# Patient Record
Sex: Female | Born: 1955 | Hispanic: No | Marital: Married | Smoking: Former smoker
Health system: Southern US, Community
[De-identification: ages and names within clinical notes are randomized; demographics above are authoritative.]

## PROBLEM LIST (undated history)

## (undated) DIAGNOSIS — E079 Disorder of thyroid, unspecified: Secondary | ICD-10-CM

## (undated) DIAGNOSIS — Z87898 Personal history of other specified conditions: Secondary | ICD-10-CM

## (undated) DIAGNOSIS — Z780 Asymptomatic menopausal state: Secondary | ICD-10-CM

## (undated) DIAGNOSIS — Z9189 Other specified personal risk factors, not elsewhere classified: Secondary | ICD-10-CM

## (undated) DIAGNOSIS — E039 Hypothyroidism, unspecified: Secondary | ICD-10-CM

## (undated) DIAGNOSIS — B977 Papillomavirus as the cause of diseases classified elsewhere: Secondary | ICD-10-CM

## (undated) DIAGNOSIS — E781 Pure hyperglyceridemia: Secondary | ICD-10-CM

## (undated) HISTORY — PX: COLONOSCOPY: SHX174

## (undated) HISTORY — DX: Asymptomatic menopausal state: Z78.0

## (undated) HISTORY — DX: Pure hyperglyceridemia: E78.1

## (undated) HISTORY — PX: BREAST SURGERY: SHX581

## (undated) HISTORY — PX: WISDOM TOOTH EXTRACTION: SHX21

## (undated) HISTORY — DX: Other specified personal risk factors, not elsewhere classified: Z91.89

## (undated) HISTORY — DX: Papillomavirus as the cause of diseases classified elsewhere: B97.7

## (undated) HISTORY — PX: AUGMENTATION MAMMAPLASTY: SUR837

---

## 1987-10-10 DIAGNOSIS — E079 Disorder of thyroid, unspecified: Secondary | ICD-10-CM

## 1987-10-10 HISTORY — DX: Disorder of thyroid, unspecified: E07.9

## 2006-02-12 ENCOUNTER — Other Ambulatory Visit: Admission: RE | Admit: 2006-02-12 | Discharge: 2006-02-12 | Payer: Self-pay | Admitting: Obstetrics and Gynecology

## 2007-03-12 ENCOUNTER — Other Ambulatory Visit: Admission: RE | Admit: 2007-03-12 | Discharge: 2007-03-12 | Payer: Self-pay | Admitting: Obstetrics & Gynecology

## 2008-03-27 ENCOUNTER — Other Ambulatory Visit: Admission: RE | Admit: 2008-03-27 | Discharge: 2008-03-27 | Payer: Self-pay | Admitting: Obstetrics and Gynecology

## 2008-10-13 ENCOUNTER — Other Ambulatory Visit: Admission: RE | Admit: 2008-10-13 | Discharge: 2008-10-13 | Payer: Self-pay | Admitting: Obstetrics and Gynecology

## 2009-10-09 HISTORY — PX: BREAST ENHANCEMENT SURGERY: SHX7

## 2010-10-09 DIAGNOSIS — Z780 Asymptomatic menopausal state: Secondary | ICD-10-CM

## 2010-10-09 HISTORY — DX: Asymptomatic menopausal state: Z78.0

## 2011-12-16 ENCOUNTER — Emergency Department
Admission: EM | Admit: 2011-12-16 | Discharge: 2011-12-16 | Disposition: A | Discharge status: Home / Self Care | Payer: Self-pay | Source: Home / Self Care

## 2011-12-16 DIAGNOSIS — T2112XA Burn of first degree of abdominal wall, initial encounter: Secondary | ICD-10-CM

## 2011-12-16 HISTORY — DX: Disorder of thyroid, unspecified: E07.9

## 2011-12-16 MED ORDER — SILVER SULFADIAZINE 1 % EX CREA: TOPICAL_CREAM | Freq: Every day | CUTANEOUS | Status: AC

## 2011-12-16 NOTE — ED Provider Notes (Signed)
Subjective:    Patient ID: Becky Price is a 56 y.o. female.  Chief Complaint:  HPI Comments: Pt was curling her hair 1 week ago and accidentally put curling iron on L groin. Pt has been putting general dressings on area for the last week. Pt started putting neosporin with bandages later in week after pt started working out. Pt wanted area evaluated to make sure that burn was not infected. Pt denies any systemic sxs including fevers, chills, generalized malaise. No purulent drainage from area, though there has been some serosanguinous drainage.   Patient is a 56 y.o. female presenting with burn. The history is provided by the patient.  Burn  Episode onset: 1 week ago  The incident occurred at home. The injury mechanism was a thermal burn. The wounds were self-inflicted. There is an injury to the left thigh. The pain is mild. There have been no prior injuries to these areas.    Past Medical History  Diagnosis Date  . Thyroid disease     History reviewed. No pertinent past surgical history.  History reviewed. No pertinent family history.  History  Substance Use Topics  . Smoking status: Former Games developer  . Smokeless tobacco: Not on file  . Alcohol Use:     Review of Systems  Constitutional: Negative for fever, chills, diaphoresis, activity change and fatigue.    Objective:   Temp 97.9 F (36.6 C)  Physical Exam  Constitutional: She appears well-developed and well-nourished.  HENT:  Head: Normocephalic and atraumatic.  Neck: Normal range of motion.  Cardiovascular: Normal rate and regular rhythm.   Pulmonary/Chest: Effort normal.  Abdominal: Soft.  Skin:       Assessment:   Procedures  There were no encounter diagnoses.  Plan:   L groin partial thickness burn: Overall healing well. Area dressed with silvadene and mepilex today. Discussed general care. Rx for silvadene given. No signs of infection. Infectious red flags discussed. Uptodate handout on burns given to  pt for review.

## 2011-12-16 NOTE — ED Provider Notes (Signed)
Pt seen by Dr. Newton  Rommie Dunn H Seana Underwood, MD 12/16/11 1204 

## 2011-12-16 NOTE — Discharge Instructions (Signed)
What are the symptoms of a skin burn? -- The symptoms depend on how bad your skin burn is. Here are the terms doctors use to describe different types of burns: Superficial skin burn (used to be called a "first-degree burn"): The burn is only on the top layer of your skin. Your skin will be dry, red, and painful (picture 1). When you press the burn, it turns white. Superficial skin burns heal in 3 to 6 days without leaving a scar.  Superficial partial-thickness burn (used to be called a "second-degree burn"): The burn is on the top 2 layers of your skin, but does not go deep into the second layer. Your skin will hurt with a light touch or if the air temperature changes. The skin will be red and leak fluid, and you might get blisters (picture 2). The burn will turn white when you press it. Superficial partial-thickness burns take 7 to 21 days to heal, and the area of skin that was burned might be darker or lighter than it used to be. The burn might or might not leave a scar.  Deep partial-thickness burn (used to be called a "third-degree burn"): This kind of burn also affects the top 2 layers of skin, but is deeper than a superficial partial-thickness burn (picture 3). The burn will hurt when you press it hard, but it will not turn white. You will get blisters. This kind of burn takes more than 21 days to heal, and will probably leave a scar.  Full-thickness burn (used to be called a "fourth-degree burn"): Full-thickness burns include all the layers of the skin and often affect the fat and muscle underneath. The burn does not usually hurt, and the burned skin can be white, gray, or black (picture 4). The skin feels dry. Your doctor will treat this kind of burn with surgery. You might also need to stay in the hospital for a time and take medicines.  Should I see a doctor or nurse? -- See your doctor or nurse right away if you are not sure how bad your burn is, or if the burn:  Involves your face, hands, feet, or  genitals  Is on or near a joint, such as your knee or shoulder  Goes all the way around a part of your body (such as your arm or leg)  Is bigger than 3 inches across or goes deep into the skin  Causes a fever of at least 100.70F or shows other signs of infection. Infected skin gets more and more red, is painful, and may leak pus.  Goes deeper than the top layer of skin AND you have not had a tetanus shot in more than 5 years People younger than 5 years or older than 70 years with any kind of skin burn should also see their doctor. Plus, people who have trouble fighting infection, for example because they have cancer or HIV, should see a doctor if they have a burn that goes deeper than a superficial burn. Is there anything I can do on my own to feel better? -- If your burn is not too severe, you can take the following steps: Clean the burn: Take any clothes off of the area and wash it with cool water and plain soap. If your clothes stick to the burn, go to the emergency room.  Cool the burn: After you have cleaned the skin, you can put a cool cloth on it or soak your skin in cool water. Do not use  ice to cool the burn.  Prevent infection: If the burn goes deeper than the top layer of skin, you are at risk of infection. To help prevent infection, you can use aloe vera gel or cream, or an antibiotic cream. If your burn forms blisters, cover it with a clean, non-stick bandage and change the bandage once or twice a day. Do not pop the blisters, because that can lead to infection.  Treat pain: If the burn hurts, try raising the burned part of your body to a level above your heart. For instance, if you burned your foot, try lying down and propping your foot up on pillows. This slows blood flow to the area and can prevent swelling and ease pain. You can also take over-the-counter pain medicine, such as acetaminophen (sold as Tylenol) or ibuprofen (sold as Advil or Motrin).  Do not scratch the burn: Scratching  can increase the risk of infection. How is a skin burn treated? -- If the burn does not need a doctor's care right away, the first step is to try the things you can do on your own. If you have a more serious burn, your doctor might give you a stronger medicine for pain or apply special bandages. For a severe burn, your doctor might suggest surgery to repair the area that was burned. He or she might also prescribe medicines to prevent infection.  Can skin burns be prevented? -- You can reduce the chances that you or your children will get burned by: Keeping candles, matches, and lighters away from children  Keeping any hot objects away from the edge of the table or stove (examples include foods or liquids, or the handles of pots and pans)  Using a humidifier with cool mist, not warm mist or steam  Keeping children away from hot stoves, fireplaces, and ovens  Having a smoke detector on each floor of your home  Dressing your children in clothes that do not catch fire easily--especially at night. Clothes made from cotton are a good choice.  Setting your hot-water heater no higher than 120F  Covering car seats and seat belts with a cloth if the car is sitting in the sun on a hot day  Using sunscreen if you are going to be in the sun More on this topic Patient information: Skin burns (Beyond the Basics) Patient information: Electrical burns (The Basics) All topics are updated as new evidence becomes available and our peer review process is complete.  This topic retrieved from UpToDate on: Dec 16, 2011.

## 2011-12-16 NOTE — ED Notes (Signed)
Dropped curling iron on upper left leg one week ago, denies pain states she just want to make sure it's not infected.

## 2013-01-13 ENCOUNTER — Ambulatory Visit (INDEPENDENT_AMBULATORY_CARE_PROVIDER_SITE_OTHER): Payer: Commercial Indemnity | Admitting: Nurse Practitioner

## 2013-01-13 ENCOUNTER — Encounter: Payer: Self-pay | Admitting: Nurse Practitioner

## 2013-01-13 VITALS — BP 102/70 | HR 76 | Resp 16 | Ht 61.0 in | Wt 115.0 lb

## 2013-01-13 DIAGNOSIS — Z01419 Encounter for gynecological examination (general) (routine) without abnormal findings: Secondary | ICD-10-CM

## 2013-01-13 DIAGNOSIS — Z1231 Encounter for screening mammogram for malignant neoplasm of breast: Secondary | ICD-10-CM

## 2013-01-13 DIAGNOSIS — R1013 Epigastric pain: Secondary | ICD-10-CM

## 2013-01-13 DIAGNOSIS — R87810 Cervical high risk human papillomavirus (HPV) DNA test positive: Secondary | ICD-10-CM

## 2013-01-13 DIAGNOSIS — Z0000 Encounter for general adult medical examination without abnormal findings: Secondary | ICD-10-CM

## 2013-01-13 LAB — POCT URINALYSIS DIPSTICK
Blood, UA: NEGATIVE
Glucose, UA: NEGATIVE
Leukocytes, UA: NEGATIVE
Nitrite, UA: NEGATIVE
Urobilinogen, UA: NEGATIVE

## 2013-01-13 NOTE — Progress Notes (Signed)
57 y.o. G0P0000 Married Caucasian Fe here for annual exam. She is postmenopausal and having no vaginal bleeding.  Not on HRT.  She has a had abdomen pain starting 3/14 sudden onset occurs as a 'pushing' pressure. Usually occurs randomly and at both upper quadrants and both lower quadrants, but not at same time.  Symptoms are more predominant upper abdomen. No increase of pain with movement or exercise - in fact being up makes symptoms fel better. . States areas feel like 'spasm'.  BM's more constipated since onset.  Took Ducolox with results and no blood or mucous. She went to [facility] on 01/05/13 and routine acute abdominal series was normal along with CBC. Then Dr. Juleen China saw pt.last Wednesday and  she is scheduled to see [facility] - Dr. Lucila Maine on Friday.  Patient has had a lot of increased stressors with death of brother and mother this past year. Husband also who worked in [place] is now home every day.  She drinks a bottle of wine daily.  Before her GI appointment on Friday is going to stop ETOH consumption to see if symptoms are improved.   No LMP recorded. Patient is postmenopausal.          Sexually active: yes  The current method of family planning is none.    Exercising: yes  Home exercise routine includes exercise ball and walking. Smoker:  no  Health Maintenance: Pap:  2 yrs ago neg MMG:  8 yrs.  Will make apt for pt. Colonoscopy:  none BMD:   none TDaP:  None, last tetanus 2005 Labs: Reviewed from 01/05/13 with nl. CBC   reports that she has quit smoking. She has never used smokeless tobacco. She reports that she drinks about 2.5 ounces of alcohol per week. She reports that she does not use illicit drugs.  Past Medical History  Diagnosis Date  . Thyroid disease     History reviewed. No pertinent past surgical history.  Current Outpatient Prescriptions  Medication Sig Dispense Refill  . levothyroxine (SYNTHROID, LEVOTHROID) 100 MCG tablet Take 125 mcg  by mouth daily.        No current facility-administered medications for this visit.    Family History  Problem Relation Age of Onset  . Pancreatic cancer Father   . Alcoholism Father   . Alcoholism Sister   . Diabetes Brother     ROS:  Pertinent items are noted in HPI.  Otherwise, a comprehensive ROS was negative.  Exam:   BP 102/70  Pulse 76  Resp 16  Ht 5\' 1"  (1.549 m)  Wt 115 lb (52.164 kg)  BMI 21.74 kg/m2  Weight change: @WEIGHT  CHANGE@ Height:   Height: 5\' 1"  (154.9 cm)  Ht Readings from Last 3 Encounters:  01/13/13 5\' 1"  (1.549 m)    General appearance: alert, cooperative and appears stated age Head: Normocephalic, without obvious abnormality, atraumatic Neck: no adenopathy, supple, symmetrical, trachea midline and thyroid normal to inspection and palpation Lungs: clear to auscultation bilaterally Breasts: normal appearance, no masses or tenderness Heart: regular rate and rhythm Abdomen: soft, non-tender; bowel sounds normal; no masses,  no organomegaly, no distinct tenderness any quadrants. Extremities: extremities normal, atraumatic, no cyanosis or edema Skin: Skin color, texture, turgor normal. No rashes or lesions Lymph nodes: Cervical, supraclavicular, and axillary nodes normal. No abnormal inguinal nodes palpated Neurologic: Grossly normal   Pelvic: External genitalia:  no lesions  Urethra:  normal appearing urethra with no masses, tenderness or lesions              Bartholin's and Skene's: normal                 Vagina: normal appearing vagina with normal color and discharge, no lesions              Cervix: anteverted              Pap taken: yes and HR HPV Bimanual Exam:  Uterus:  normal size, contour, position, consistency, mobility, non-tender              Adnexa: no mass, fullness, tenderness and could not reproduce similiar pain.               Rectovaginal: Confirms               Anus:  normal sphincter tone, no lesions  A:  Well Woman  with normal exam  Recent onset of Abdominal pain - to see GI on 01/17/13  Family Stressors with recent increase in ETOH consumption.   P:   [facility] and have them to call patient with date  pap smear  counseled on mammography screening, adequate intake of  calcium and vitamin D, diet and exercise  return annually or prn  An After Visit Summary was printed and given to the patient.

## 2013-01-13 NOTE — Patient Instructions (Addendum)

## 2013-01-15 NOTE — Progress Notes (Signed)
Encounter reviewed by Dr. Infantof Villagomez Silva.  

## 2013-01-17 LAB — HEMOGLOBIN, FINGERSTICK: Hemoglobin, fingerstick: 13 g/dL (ref 12.0–16.0)

## 2013-01-17 NOTE — Addendum Note (Signed)
Addended by: Clide Dales R on: 01/17/2013 02:49 PM   Modules accepted: Orders

## 2013-02-04 ENCOUNTER — Telehealth: Payer: Self-pay | Admitting: *Deleted

## 2013-02-04 LAB — IPS PAP TEST WITH HPV

## 2013-02-04 NOTE — Telephone Encounter (Signed)
Opened in error

## 2014-07-14 ENCOUNTER — Telehealth: Payer: Self-pay | Admitting: *Deleted

## 2014-07-14 NOTE — Telephone Encounter (Signed)
S/W pt re: AEX repeat pap. recall 8. Patient scheduled appt 07/01/14 @11 :15am

## 2014-07-31 ENCOUNTER — Ambulatory Visit (INDEPENDENT_AMBULATORY_CARE_PROVIDER_SITE_OTHER): Payer: Commercial Indemnity | Admitting: Nurse Practitioner

## 2014-07-31 ENCOUNTER — Encounter: Payer: Self-pay | Admitting: Nurse Practitioner

## 2014-07-31 VITALS — BP 120/70 | HR 64 | Ht 61.0 in | Wt 117.0 lb

## 2014-07-31 DIAGNOSIS — Z0000 Encounter for general adult medical examination without abnormal findings: Secondary | ICD-10-CM

## 2014-07-31 DIAGNOSIS — Z1211 Encounter for screening for malignant neoplasm of colon: Secondary | ICD-10-CM

## 2014-07-31 DIAGNOSIS — Z01419 Encounter for gynecological examination (general) (routine) without abnormal findings: Secondary | ICD-10-CM

## 2014-07-31 LAB — POCT URINALYSIS DIPSTICK
BILIRUBIN UA: NEGATIVE
Blood, UA: NEGATIVE
Glucose, UA: NEGATIVE
KETONES UA: NEGATIVE
NITRITE UA: NEGATIVE
PROTEIN UA: NEGATIVE
Urobilinogen, UA: NEGATIVE
pH, UA: 7

## 2014-07-31 LAB — HEMOGLOBIN, FINGERSTICK: HEMOGLOBIN, FINGERSTICK: 13 g/dL (ref 12.0–16.0)

## 2014-07-31 NOTE — Progress Notes (Signed)
Patient ID: Becky Price, female   DOB: 06/02/1956, 58 y.o.   MRN: [id number] 58 y.o. G0P0 Married Caucasian Fe here for annual exam.  She feels well.  No new health problems.  No vaso symptoms.  Husband is now retired does not work in [place] any longer!  Patient's last menstrual period was 08/09/2010.          Sexually active: yes  The current method of family planning is post menopausal status.  Exercising: yes Home exercise routine includes cardio. Smoker: no   Health Maintenance:  Pap: 01/13/13, WNL, pos HR HPV, neg 16/18  (DES Exposure - pap yearly) MMG:  Pt overdue, would like referral to location that is good with implants. Colonoscopy: none  BMD: none  TDaP: last tetanus 2005 (declines update today) Labs:  HB:  13.0  Urine:  Trace leuk's (contaminated - dropped cup)   reports that she has quit smoking. She has never used smokeless tobacco. She reports that she drinks about 3.5 ounces of alcohol per week. She reports that she does not use illicit drugs.  Past Medical History  Diagnosis Date  . Postmenopausal state 2012    No HRT  . DES exposure in utero   . Thyroid disease 1989    Dr. Juleen ChinaKohut    Past Surgical History  Procedure Laterality Date  . Breast enhancement surgery  2011    Current Outpatient Prescriptions  Medication Sig Dispense Refill  . levothyroxine (SYNTHROID, LEVOTHROID) 125 MCG tablet Take 125 mcg by mouth daily before breakfast.       No current facility-administered medications for this visit.    Family History  Problem Relation Age of Onset  . Pancreatic cancer Father [id number]  . Alcoholism Father   . Alcoholism Sister   . Diabetes Brother   . Heart failure Maternal Grandfather     elderly    ROS:  Pertinent items are noted in HPI.  Otherwise, a comprehensive ROS was negative.  Exam:   BP 120/70  Pulse 64  Ht 5\' 1"  (1.549 m)  Wt 117 lb (53.071 kg)  BMI 22.12 kg/m2  LMP 08/09/2010 Height: 5\' 1"  (154.9 cm)  Ht Readings from Last 3 Encounters:   07/31/14 5\' 1"  (1.549 m)  01/13/13 5\' 1"  (1.549 m)    General appearance: alert, cooperative and appears stated age Head: Normocephalic, without obvious abnormality, atraumatic Neck: no adenopathy, supple, symmetrical, trachea midline and thyroid normal to inspection and palpation Lungs: clear to auscultation bilaterally Breasts: normal appearance, no masses or tenderness, positive findings: implants bilaterally Heart: regular rate and rhythm Abdomen: soft, non-tender; no masses,  no organomegaly Extremities: extremities normal, atraumatic, no cyanosis or edema Skin: Skin color, texture, turgor normal. No rashes or lesions Lymph nodes: Cervical, supraclavicular, and axillary nodes normal. No abnormal inguinal nodes palpated Neurologic: Grossly normal   Pelvic: External genitalia:  no lesions              Urethra:  normal appearing urethra with no masses, tenderness or lesions              Bartholin's and Skene's: normal                 Vagina: normal appearing vagina with normal color and discharge, no lesions              Cervix: anteverted              Pap taken: Yes.   Bimanual Exam:  Uterus:  normal size,  contour, position, consistency, mobility, non-tender              Adnexa: no mass, fullness, tenderness               Rectovaginal: Confirms               Anus:  normal sphincter tone, no lesion  A:         Well Woman with normal exam  Postmenopausal  - no HRT  History of infertility          History of DES exposure  S/P Breast Augmentation  Hypothyroid - followed by Endo   P:   Reviewed health and wellness pertinent to exam  Pap smear taken today  Mammogram is past due and [facility]  Referral to Dr. Loreta AveMann for screening colonoscopy  Counseled on breast self exam, mammography screening, adequate intake of calcium and vitamin D, diet and exercise, Kegel's exercises return annually or prn  An After Visit Summary was printed and given to the  patient.

## 2014-07-31 NOTE — Patient Instructions (Signed)

## 2014-08-02 NOTE — Progress Notes (Signed)
Encounter reviewed by Dr. Brook Silva.  

## 2014-08-06 LAB — IPS PAP TEST WITH HPV

## 2014-08-13 ENCOUNTER — Telehealth: Payer: Self-pay | Admitting: Emergency Medicine

## 2014-08-13 DIAGNOSIS — B977 Papillomavirus as the cause of diseases classified elsewhere: Secondary | ICD-10-CM

## 2014-08-13 NOTE — Telephone Encounter (Signed)
-----   Message from Lauro FranklinPatricia Rolen-Grubb, FNP sent at 08/10/2014  9:07 PM EST ----- Per Dr. Edward JollySilva please schedule colo biopsy for this patient - second time with normal pap and HR HPV

## 2014-08-13 NOTE — Telephone Encounter (Signed)
Patient notified of message from Lauro FranklinPatricia Rolen-Grubb, FNP.   She is agreeable to scheduling colposcopy. Brief description of procedure given to patient.  Colposcopy pre-procedure instructions given. Advised 800 mg of Motrin with food one hour prior to appointment. Motrin=Advil=Ibuprofen Can take 800 mg (Can purchase over the counter, you will need four 200 mg pills). Make sure to eat a meal before appointment and drink plenty of fluids. Patient verbalized understanding and will call to reschedule if will be on menses or has any concerns regarding pregnancy. Advised will need to cancel within 24 hours or will have $100.00 no show fee placed to account.  Type of birth control: The current method of family planning is post menopausal status.    Sabrina, I have scheduled procedures. Can you call with insurance pre certification information?  Routing to provider for final review. Patient agreeable to disposition. Will close encounter

## 2014-08-14 ENCOUNTER — Telehealth: Payer: Self-pay | Admitting: Obstetrics and Gynecology

## 2014-08-14 NOTE — Telephone Encounter (Signed)
Patient returned call. Advised patient that per benefit quote received, she will be responsible to pay $200.01 when she comes in for colpo. Patient agreeable.

## 2014-08-14 NOTE — Telephone Encounter (Signed)
Left message for patient to call back. Need to go over benefits for colpo scheduled 11.19.2015. PR: $200.01

## 2014-08-27 ENCOUNTER — Encounter: Payer: Self-pay | Admitting: Obstetrics and Gynecology

## 2014-08-27 ENCOUNTER — Ambulatory Visit (INDEPENDENT_AMBULATORY_CARE_PROVIDER_SITE_OTHER): Payer: Commercial Indemnity | Admitting: Obstetrics and Gynecology

## 2014-08-27 VITALS — BP 118/76 | HR 64 | Ht 61.0 in | Wt 119.6 lb

## 2014-08-27 DIAGNOSIS — B977 Papillomavirus as the cause of diseases classified elsewhere: Secondary | ICD-10-CM

## 2014-08-27 DIAGNOSIS — N952 Postmenopausal atrophic vaginitis: Secondary | ICD-10-CM

## 2014-08-27 NOTE — Patient Instructions (Signed)

## 2014-08-27 NOTE — Progress Notes (Signed)
Subjective:     Patient ID: Lowanda FosterJulie Kushnir, female   DOB: 06/16/1956, 58 y.o.   MRN: [id number]  HPI  Patient is here for colposcopy today.  Pap 07/31/14 - normal and positive HR HPV. Pap 04/14/13 - normal and positive HR HPV.  Negative HPV 16 and 18.   History of DES exposure.  Postmenopausal.  Not using any HRT. States she does not have any problems with bleeding with intercourse.  Notes general vaginal itching.   Several years since doing a mammogram.  No childbirth.   Review of Systems     Objective:   Physical Exam  Genitourinary:     Colposcopy Consent for procedure. Pederson speculum placed in the vagina.  No discharge noted.  3% acetic acid placed.  Colposcopy performed.  Pin point opening of the cervical os - unsatisfactory colposcopy.  Severe atrophy noted and tissues developed petechial changes. Exam uncomfortable for the patient and she exhibited signs of anxiety throughout the procedure but not afterward. Biopsies of 6:00 and 12:00 taken of the exocervix.  Each sent separately to pathology.  Unable to do ECC even with a pap brush.  Minimal bleeding.  No complications.     Assessment:     Positive HR HPV.  Severe atrophy of the vagina. History of DES exposure.     Plan:     Colposcopy instructions given.  Follow up biopsies.  I discussed vaginal estrogen with the patient.  Risks of breast cancer, DVT, PE, MI, and stroke reviewed.  Patient needs to do her mammogram prior to a prescription.  She will make this call and schedule this.   15 additional minutes spent discussing vaginal estrogen cream and atrophic vaginitis.  Over 50% was spent in counseling.   After visit summary to patient.

## 2014-09-01 LAB — IPS OTHER TISSUE BIOPSY

## 2014-09-07 ENCOUNTER — Other Ambulatory Visit: Payer: Self-pay

## 2014-09-07 ENCOUNTER — Telehealth: Payer: Self-pay | Admitting: Obstetrics and Gynecology

## 2014-09-07 DIAGNOSIS — Z1231 Encounter for screening mammogram for malignant neoplasm of breast: Secondary | ICD-10-CM

## 2014-09-07 NOTE — Telephone Encounter (Signed)
Notes Recorded by Alphonsa OverallAmanda L Dixon, CMA on 09/07/2014 at 1:05 PM 08 pap recall entered 05-10-15. ------  Notes Recorded by Alphonsa OverallAmanda L Dixon, CMA on 09/07/2014 at 1:01 PM Patient notified and states she will call [facility] today and make an appointment for a mammogram.

## 2014-09-07 NOTE — Telephone Encounter (Signed)
Patient has been informed of results as below and has appointment scheduled at [facility] 09/23/14 at 1620.  Routing to provider for final review. Patient agreeable to disposition. Will close encounter

## 2014-09-07 NOTE — Telephone Encounter (Signed)
Patient is calling for results from 08/27/14 appointment with Dr.Silva. Patient also has a question regarding scheduling her MMG appointment. Patient says Shirlyn GoltzPatty Grubb recommended a certain MMG facility for MMG with implants. Patient cannot remember the name of the facility.

## 2014-09-23 ENCOUNTER — Ambulatory Visit
Admission: RE | Admit: 2014-09-23 | Discharge: 2014-09-23 | Disposition: A | Discharge status: Home / Self Care | Payer: Managed Care, Other (non HMO) | Source: Ambulatory Visit

## 2014-09-23 ENCOUNTER — Telehealth: Payer: Self-pay | Admitting: Nurse Practitioner

## 2014-09-23 DIAGNOSIS — Z1231 Encounter for screening mammogram for malignant neoplasm of breast: Secondary | ICD-10-CM

## 2014-09-23 NOTE — Telephone Encounter (Signed)
Pt is scheduled for a colonoscopy but wondering if Patty has any information on the at home colonoscopy test called the cologuard.

## 2014-09-24 NOTE — Telephone Encounter (Signed)
Routing to Patricia Rolen-Grubb, FNP for review and advice.   

## 2014-09-24 NOTE — Telephone Encounter (Signed)
I believe this to be a home stool test like the one we do IFOB.  It is not a home colonoscopy.

## 2014-09-25 NOTE — Telephone Encounter (Signed)
Left Message To Call Back  

## 2014-09-25 NOTE — Telephone Encounter (Signed)
S/w patient and notified her of the IFOB/home stool test she said she contacted her office that's doing the colonoscopy and she is scheduled for Monday and will see them then.  Routed to provider for review, encounter closed.

## 2014-09-28 ENCOUNTER — Other Ambulatory Visit: Payer: Self-pay | Admitting: Nurse Practitioner

## 2014-09-28 DIAGNOSIS — R928 Other abnormal and inconclusive findings on diagnostic imaging of breast: Secondary | ICD-10-CM

## 2014-09-30 ENCOUNTER — Ambulatory Visit
Admission: RE | Admit: 2014-09-30 | Discharge: 2014-09-30 | Disposition: A | Discharge status: Home / Self Care | Payer: Managed Care, Other (non HMO) | Source: Ambulatory Visit | Attending: Nurse Practitioner | Admitting: Nurse Practitioner

## 2014-09-30 ENCOUNTER — Other Ambulatory Visit: Payer: Self-pay | Admitting: Nurse Practitioner

## 2014-09-30 DIAGNOSIS — R928 Other abnormal and inconclusive findings on diagnostic imaging of breast: Secondary | ICD-10-CM

## 2014-10-08 ENCOUNTER — Other Ambulatory Visit: Payer: Managed Care, Other (non HMO)

## 2014-10-12 ENCOUNTER — Other Ambulatory Visit: Payer: Managed Care, Other (non HMO)

## 2015-08-03 ENCOUNTER — Ambulatory Visit: Payer: Commercial Indemnity | Admitting: Nurse Practitioner

## 2015-08-03 ENCOUNTER — Encounter: Payer: Self-pay | Admitting: Nurse Practitioner

## 2015-10-25 ENCOUNTER — Telehealth: Payer: Self-pay | Admitting: *Deleted

## 2015-10-25 NOTE — Telephone Encounter (Signed)
08 Pap recall due 05/2015 due to positive HR HPV on 07/31/14 pap.  Past History:   08/27/14 Colpo, Atypia w/atrophic vaginitis  07/31/14 Pap, Negative with pos HR HPV 01/13/13 Pap, Negative with pos HR HPV, neg 16/18  Pt has not scheduled AEX with Shirlyn GoltzPatty Grubb, FNP.  Please call pt to schedule AEX.  Thank you.

## 2015-11-17 NOTE — Telephone Encounter (Signed)
Call to patient to schedule her annual and she stated she will have to call back later to schedule this appointment.

## 2015-11-22 ENCOUNTER — Encounter: Payer: Self-pay | Admitting: *Deleted

## 2015-11-22 NOTE — Telephone Encounter (Signed)
Pt declined scheduling at time of call and has not returned call to schedule annual exam.  Letter created.  Please advise recall.

## 2015-11-24 NOTE — Telephone Encounter (Signed)
Please print letter so can be signed and remove from recall.

## 2015-11-25 NOTE — Telephone Encounter (Signed)
Letter mailed to patient and marked as sent in EPIC.  Pt removed from current recall.  Closing encounter.  

## 2017-01-15 ENCOUNTER — Encounter: Payer: Self-pay | Admitting: Nurse Practitioner

## 2017-01-15 ENCOUNTER — Ambulatory Visit (INDEPENDENT_AMBULATORY_CARE_PROVIDER_SITE_OTHER): Payer: Managed Care, Other (non HMO) | Admitting: Nurse Practitioner

## 2017-01-15 VITALS — BP 116/74 | HR 60 | Ht 61.0 in | Wt 106.0 lb

## 2017-01-15 DIAGNOSIS — Z9189 Other specified personal risk factors, not elsewhere classified: Secondary | ICD-10-CM

## 2017-01-15 DIAGNOSIS — Z01411 Encounter for gynecological examination (general) (routine) with abnormal findings: Secondary | ICD-10-CM | POA: Diagnosis not present

## 2017-01-15 DIAGNOSIS — B977 Papillomavirus as the cause of diseases classified elsewhere: Secondary | ICD-10-CM | POA: Diagnosis not present

## 2017-01-15 DIAGNOSIS — N644 Mastodynia: Secondary | ICD-10-CM

## 2017-01-15 DIAGNOSIS — Z0000 Encounter for general adult medical examination without abnormal findings: Secondary | ICD-10-CM

## 2017-01-15 DIAGNOSIS — Z23 Encounter for immunization: Secondary | ICD-10-CM

## 2017-01-15 DIAGNOSIS — E039 Hypothyroidism, unspecified: Secondary | ICD-10-CM

## 2017-01-15 LAB — LIPID PANEL
Cholesterol: 228 mg/dL — ABNORMAL HIGH (ref <200)
HDL: 105 mg/dL (ref >50)
LDL Cholesterol: 99 mg/dL (ref <100)
Total CHOL/HDL Ratio: 2.2 Ratio (ref <5.0)
Triglycerides: 122 mg/dL (ref <150)
VLDL: 24 mg/dL (ref <30)

## 2017-01-15 LAB — CBC
HCT: 40.6 % (ref 35.0–45.0)
Hemoglobin: 13.3 g/dL (ref 11.7–15.5)
MCH: 31.3 pg (ref 27.0–33.0)
MCHC: 32.8 g/dL (ref 32.0–36.0)
MCV: 95.5 fL (ref 80.0–100.0)
MPV: 10 fL (ref 7.5–12.5)
Platelets: 191 10*3/uL (ref 140–400)
RBC: 4.25 MIL/uL (ref 3.80–5.10)
RDW: 13.9 % (ref 11.0–15.0)
WBC: 5.1 10*3/uL (ref 3.8–10.8)

## 2017-01-15 LAB — COMPREHENSIVE METABOLIC PANEL
ALT: 13 U/L (ref 6–29)
AST: 20 U/L (ref 10–35)
Albumin: 4.7 g/dL (ref 3.6–5.1)
Alkaline Phosphatase: 50 U/L (ref 33–130)
BILIRUBIN TOTAL: 0.6 mg/dL (ref 0.2–1.2)
BUN: 11 mg/dL (ref 7–25)
CO2: 27 mmol/L (ref 20–31)
Calcium: 9.8 mg/dL (ref 8.6–10.4)
Chloride: 103 mmol/L (ref 98–110)
Creat: 0.58 mg/dL (ref 0.50–1.05)
Glucose, Bld: 90 mg/dL (ref 65–99)
Potassium: 4 mmol/L (ref 3.5–5.3)
Sodium: 139 mmol/L (ref 135–146)
Total Protein: 7.1 g/dL (ref 6.1–8.1)

## 2017-01-15 LAB — HEPATITIS C ANTIBODY: HCV Ab: NEGATIVE

## 2017-01-15 MED ORDER — VALACYCLOVIR HCL 1 G PO TABS: 1000.0000 mg | ORAL_TABLET | Freq: Two times a day (BID) | ORAL | 1 refills | Status: DC

## 2017-01-15 NOTE — Progress Notes (Signed)
Patient scheduled while in office for bilateral diagnostic MMG and right breast ultraosund for right breast tenderness. Spoke with Becky Price at [facility]. Patient scheduled for 01/17/17 at 9:30am . Patient is agreeable to date and time.

## 2017-01-15 NOTE — Progress Notes (Signed)
61 y.o. G0P0 Married  Caucasian Fe here for annual exam.  She has been done a lot of upper body exercise and has had a tinge of discomfort in the right breast. She has bilateral implants and was concerned. Endo Dr. Juleen China is following her thyroid.  She lost her mother this past year.  Now parents and her 2 siblings are deceased.  She does admit to drinking more that usual since mother's death.  She feels very sad that she was not around when her mother died.  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   Patient's last menstrual period was 08/09/2010 (approximate).          Sexually active: Yes.    The current method of family planning is post menopausal status.    Exercising: Yes.    cardio and pilates Smoker:  no  Health Maintenance: Pap: Colpo: 08/27/14, atypia due to atrophic changes  Pap: 07/31/14, Negative with pos HR HPV  Pap: 01/13/13, WNL, pos HR HPV, neg 16/18  (DES Exposure - pap yearly) MMG: 09/23/14 with diagnostic right on 09/30/14, Bi-Rads 1:  Negative Colonoscopy: 09/27/14, benign polyp, repeat in 10 years BMD: none  TDaP: last tetanus 2005 Hep C : done today Labs: done today   reports that she has quit smoking. She has never used smokeless tobacco. She reports that she drinks about 8.4 oz of alcohol per week . She reports that she does not use drugs.  Past Medical History:  Diagnosis Date  . DES exposure in utero   . HPV in female   . Postmenopausal state 2012   No HRT  . Thyroid disease 1989   Dr. Juleen China    Past Surgical History:  Procedure Laterality Date  . BREAST ENHANCEMENT SURGERY  2011    Current Outpatient Prescriptions  Medication Sig Dispense Refill  . levothyroxine (SYNTHROID, LEVOTHROID) 125 MCG tablet Take 125 mcg by mouth daily before breakfast.    . valACYclovir (VALTREX) 1000 MG tablet Take 1 tablet (1,000 mg total) by mouth 2 (two) times daily. 10 tablet 1   No current facility-administered medications for this visit.      Family History  Problem Relation Age of Onset  . Pancreatic cancer Father 19  . Alcoholism Father   . Alcoholism Sister   . Diabetes Brother   . Heart failure Maternal Grandfather     elderly    ROS:  Pertinent items are noted in HPI.  Otherwise, a comprehensive ROS was negative.  Exam:   BP 116/74 (BP Location: Right Arm, Patient Position: Sitting, Cuff Size: Normal)   Pulse 60   Ht  (1.549 m)   Wt 106 lb (48.1 kg)   LMP 08/09/2010 (Approximate)   BMI 20.03 kg/m  Height:  (154.9 cm) Ht Readings from Last 3 Encounters:  01/15/17  (1.549 m)  08/27/14  (1.549 m)  07/31/14  (1.549 m)    General appearance: alert, cooperative and appears stated age Head: Normocephalic, without obvious abnormality, atraumatic Neck: no adenopathy, supple, symmetrical, trachea midline and thyroid normal to inspection and palpation Lungs: clear to auscultation bilaterally Breasts: positive findings: implants bilaterally with minimal tenderness right upper quadrant.  There is no mass but a 'ripple' feel to implant -no sign of rupture. Heart: regular rate and rhythm Abdomen: soft, non-tender; no masses,  no organomegaly Extremities: extremities normal, atraumatic, no cyanosis or edema Skin: Skin color, texture, turgor normal. No rashes or lesions  Lymph nodes: Cervical, supraclavicular, and axillary nodes normal. No abnormal inguinal nodes palpated Neurologic: Grossly normal   Pelvic: External genitalia:  no lesions              Urethra:  normal appearing urethra with no masses, tenderness or lesions              Bartholin's and Skene's: normal                 Vagina: normal appearing vagina with normal color and discharge, no lesions              Cervix: anteverted              Pap taken: Yes.   Bimanual Exam:  Uterus:  normal size, contour, position, consistency, mobility, non-tender              Adnexa: no mass, fullness, tenderness               Rectovaginal:  Confirms               Anus:  normal sphincter tone, no lesions  Chaperone present: yes  A:  Well Woman with normal exam  Postmenopausal  - no HRT             History of infertility             History of DES exposure - pap yearly             S/P Breast Augmentation with pain on the right ? From exercise             Hypothyroid - followed by Endo  Grief reaction to mother's death  P:   Reviewed health and wellness pertinent to exam  Pap smear was done and advised to do yearly  Mammogram diagnostic with implants is scheduled while here for 01/17/17 and will follow  Counseled about increase in ETOH and she will decrease or omit  Follow with labs  Counseled on breast self exam, mammography screening, adequate intake of calcium and vitamin D, diet and exercise, Kegel's exercises return annually or prn  An After Visit Summary was printed and given to the patient.

## 2017-01-15 NOTE — Patient Instructions (Signed)

## 2017-01-16 LAB — VITAMIN D 25 HYDROXY (VIT D DEFICIENCY, FRACTURES): Vit D, 25-Hydroxy: 27 ng/mL — ABNORMAL LOW (ref 30–100)

## 2017-01-17 ENCOUNTER — Ambulatory Visit
Admission: RE | Admit: 2017-01-17 | Discharge: 2017-01-17 | Disposition: A | Discharge status: Home / Self Care | Payer: Managed Care, Other (non HMO) | Source: Ambulatory Visit | Attending: Nurse Practitioner | Admitting: Nurse Practitioner

## 2017-01-17 DIAGNOSIS — N644 Mastodynia: Secondary | ICD-10-CM

## 2017-01-17 LAB — IPS PAP TEST WITH HPV

## 2017-01-18 ENCOUNTER — Other Ambulatory Visit: Payer: Self-pay | Admitting: *Deleted

## 2017-01-18 DIAGNOSIS — R87810 Cervical high risk human papillomavirus (HPV) DNA test positive: Principal | ICD-10-CM

## 2017-01-18 DIAGNOSIS — R87610 Atypical squamous cells of undetermined significance on cytologic smear of cervix (ASC-US): Secondary | ICD-10-CM

## 2017-01-18 MED ORDER — ALPRAZOLAM 0.5 MG PO TABS: 0.5000 mg | ORAL_TABLET | Freq: Once | ORAL | 0 refills | Status: AC

## 2017-01-18 MED ORDER — METRONIDAZOLE 500 MG PO TABS: 500.0000 mg | ORAL_TABLET | Freq: Two times a day (BID) | ORAL | 0 refills | Status: DC

## 2017-01-18 NOTE — Progress Notes (Signed)
Encounter reviewed by Dr. Raney Koeppen Amundson C. Silva.  

## 2017-01-19 ENCOUNTER — Other Ambulatory Visit: Payer: Self-pay | Admitting: Obstetrics and Gynecology

## 2017-01-19 ENCOUNTER — Telehealth: Payer: Self-pay | Admitting: Obstetrics and Gynecology

## 2017-01-19 NOTE — Telephone Encounter (Signed)
Left message to call Tyne Banta at [phone].  

## 2017-01-19 NOTE — Telephone Encounter (Signed)
Spoke with patient in regards to benefits for colposcopy, see account notes. Patient states in her discussion with the nurse yesterday, she understood the nurse to say she would have the a "cervical numbing if she needed the procedure". Patient wants to clarify if she is actually having the procedure or is she just going to be examined and then it would be determined if the procedure is needed. Advised patient I will forward her questions to our Triage Team and they would return her call for clarification.  Routing to Triage Team

## 2017-01-22 ENCOUNTER — Ambulatory Visit: Payer: Self-pay | Admitting: Obstetrics and Gynecology

## 2017-01-22 NOTE — Telephone Encounter (Signed)
Spoke with patient, reviewed colposcopy procedure with patient. Advised patient Dr. Edward Jolly will look at cervix through a microscope, if any abnormal areas or areas of concern are seen, a biopsy will be taken. Advised patient if biopsies are needed a paracervical block will be done as Dr. Edward Jolly previously recommended. Patient is aware she will need to come by office to pick up Xanax RX and sign consent. Patient verbalizes understanding and thankful for clarification.   Routing to provider for final review. Patient is agreeable to disposition. Will close encounter.

## 2017-01-31 ENCOUNTER — Telehealth: Payer: Self-pay | Admitting: *Deleted

## 2017-01-31 NOTE — Telephone Encounter (Signed)
Patient in office to pick up RX for Xanax for Colposcopy. Consent procedure signed and placed in drawer.  Routing to provider for final review. Patient is agreeable to disposition. Will close encounter.

## 2017-02-01 ENCOUNTER — Ambulatory Visit (INDEPENDENT_AMBULATORY_CARE_PROVIDER_SITE_OTHER): Payer: Managed Care, Other (non HMO) | Admitting: Obstetrics and Gynecology

## 2017-02-01 ENCOUNTER — Encounter: Payer: Self-pay | Admitting: Obstetrics and Gynecology

## 2017-02-01 DIAGNOSIS — R87810 Cervical high risk human papillomavirus (HPV) DNA test positive: Secondary | ICD-10-CM

## 2017-02-01 DIAGNOSIS — R87610 Atypical squamous cells of undetermined significance on cytologic smear of cervix (ASC-US): Secondary | ICD-10-CM | POA: Diagnosis not present

## 2017-02-01 NOTE — Progress Notes (Signed)
Subjective:     Patient ID: Becky Price, female   DOB: [birth year], 61 y.o.   MRN: [id number]  HPI  Patient here today for colposcopy; pap smear 01-15-17 ASCUS:Pos HR HPV.  Took Xanax 0.5 mg in preparation for the colposcopy today.  Feels relaxed.   Previously signed her consent form.  Husband is here to drive her home.  Pap History: DES EXPOSURE Pap 07-31-14 Neg:Pos HR HPV--Colpo atypia due to atrophic changes.  Colposcopy was unsatisfactory. No follow up done until her pap this year.  Pap 01-13-13 Neg:Pos HR HPV;Neg 16/18    Review of Systems  LMP: 08-09-10 Contraception:Postmenopausal      Objective:   Physical Exam  Genitourinary:        Colposcopy Consent for procedure.  Speculum placed.  3% acetic acid placed.  White light and green light filter used.  Colposcopy unsatisfactory.  Cervix is completely closed and pale in color. No specific acetowhite lesions noted.  Atrophy noted.  Lugol's placed.   Generalized decreased uptake of the vaginal apex and cervix. No biopsy done.  No complications.     Assessment:     ASCUS and positive HR HPV.  Unsatisfactory colposcopy.  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     Plan:        Reviewed abnormal paps, colposcopy, HPV, and unsatisfactory colposcopy.  I am recommending a LEEP procedure for the patient in a hospital or outpatient surgical center setting.  She will need to return next week for further discussion due to her taking the Xanax today.  Written information given on cervical conization also. I do recommend yearly paps based on her hx of DES.  _15______ minutes face to face time of which over 50% was spent in counseling.   After visit summary to patient.

## 2017-02-01 NOTE — Patient Instructions (Signed)
Cervical Conization °Cervical conization (cone biopsy) is a procedure in which a cone-shaped portion of the cervix is cut out so that it can be examined under a microscope. The procedure is done to check for cancer cells or cells that might turn into cancer (precancerous cells). You may have this procedure if: °· You have abnormal bleeding from your cervix. °· You had an abnormal Pap test. °· Something abnormal was seen on your cervix during an exam. ° °This procedure is performed in either a health care provider’s office or in an operating room. °Tell a health care provider about: °· Any allergies you have. °· All medicines you are taking, including vitamins, herbs, eye drops, creams, and over-the-counter medicines. °· Any problems you or family members have had with the use of anesthetic medicines. °· Any blood disorders you have. °· Any surgeries you have had. °· Any medical conditions you have. °· Your smoking habits. °· When you normally have your period. °· Whether you are pregnant or may be pregnant. °What are the risks? °Generally, this is a safe procedure. However, problems may occur, including: °· Heavy bleeding for several days or weeks after the procedure. °· Allergic reactions to medicines or dyes. °· Increased risk of preterm labor in future pregnancies. °· Infection (rare). °· Damage to the cervix or other structures or organs (rare). ° °What happens before the procedure? °Staying hydrated °Follow instructions from your health care provider about hydration, which may include: °· Up to 2 hours before the procedure - you may continue to drink clear liquids, such as water, clear fruit juice, black coffee, and plain tea. ° °Eating and drinking restrictions °Follow instructions from your health care provider about eating and drinking, which may include: °· 8 hours before the procedure - stop eating heavy meals or foods such as meat, fried foods, or fatty foods. °· 6 hours before the procedure - stop eating  light meals or foods, such as toast or cereal. °· 6 hours before the procedure - stop drinking milk or drinks that contain milk. °· 2 hours before the procedure - stop drinking clear liquids. ° °General instructions °· Do not douche, have sex, use tampons, or use any vaginal medicines before the procedure as told by your health care provider. °· You may be asked to empty your bladder and bowel right before the procedure. °· Ask your health care provider about: °? Changing or stopping your normal medicines. This is important if you take diabetes medicines or blood thinners. °? Taking medicines such as aspirin and ibuprofen. These medicines can thin your blood. Do not take these medicines before your procedure if your doctor tells you not to. °· Plan to have someone take you home from the hospital or clinic. °What happens during the procedure? °· To reduce your risk of infection: °? Your health care team will wash or sanitize their hands. °? Your skin will be washed with soap. °? Hair may be removed from the surgical area. °· You will undress from the waist down and be given a gown to wear. °· You will lie on an examining table and put your feet in stirrups. °· An IV tube will be inserted into one of your veins. °· You will be given one or more of the following: °? A medicine to help you relax (sedative). °? A medicine to numb the area (local anesthetic). °? A medicine to make you fall asleep (general anesthetic). °? A medicine that numbs the cervix (cervical block). °·   A lubricated device called a speculum will be inserted into your vagina. It will be used to spread open the walls of the vagina so your health care provider can see the inside of the vagina and cervix better. °· An instrument that has a magnifying lens and a light (colposcope) will let your health care provider examine the cervix more closely. °· Your health care provider will apply a solution to your cervix. This turns abnormal areas a pale  color. °· A tissue sample will be removed from the cervix using one of the following methods: °? The cold knife method. In this method, the tissue is cut out with a knife (scalpel). °? The loop electrosurgical excision procedure (LEEP) method. In this method, the tissue is cut out with a thin wire that can burn (cauterize) the tissue with an electrical current. °? Laser treatment method. In this method, the tissue is cut out and then cauterized with a laser beam to prevent bleeding. °· Your health care provider will apply a paste over the biopsy areas to help control bleeding. °· The tissue sample will be examined under a microscope. °The procedure may vary among health care providers and hospitals. °What happens after the procedure? °· Your blood pressure, heart rate, breathing rate, and blood oxygen level will be monitored often until the medicines you were given have worn off. °· If you were given a local anesthetic, you will rest at the clinic or hospital until you are stable and feel ready to go home. °· If you were given a general anesthetic, you may be monitored for a longer period of time. °· You may have some cramping. °· You may have bloody discharge or light to moderate bleeding. °· You may have dark discharge coming from your vagina. This is from the paste used on the cervix to prevent bleeding. °Summary °· Cervical conization is a procedure in which a cone-shaped portion of the cervix is cut out so that it can be examined under a microscope. °· The procedure is done to check for cancer cells or cells that might turn into cancer (precancerous cells). °This information is not intended to replace advice given to you by your health care provider. Make sure you discuss any questions you have with your health care provider. °Document Released: 07/05/2005 Document Revised: 09/27/2016 Document Reviewed: 09/27/2016 °Elsevier Interactive Patient Education © 2017 [employer] ° °

## 2017-02-08 ENCOUNTER — Encounter: Payer: Self-pay | Admitting: Obstetrics and Gynecology

## 2017-02-08 ENCOUNTER — Ambulatory Visit (INDEPENDENT_AMBULATORY_CARE_PROVIDER_SITE_OTHER): Payer: Managed Care, Other (non HMO) | Admitting: Obstetrics and Gynecology

## 2017-02-08 VITALS — BP 118/72 | HR 80 | Ht 61.0 in | Wt 106.0 lb

## 2017-02-08 DIAGNOSIS — R87810 Cervical high risk human papillomavirus (HPV) DNA test positive: Secondary | ICD-10-CM | POA: Diagnosis not present

## 2017-02-08 DIAGNOSIS — R87610 Atypical squamous cells of undetermined significance on cytologic smear of cervix (ASC-US): Secondary | ICD-10-CM

## 2017-02-08 NOTE — Patient Instructions (Signed)
Cervical Conization °Cervical conization (cone biopsy) is a procedure in which a cone-shaped portion of the cervix is cut out so that it can be examined under a microscope. The procedure is done to check for cancer cells or cells that might turn into cancer (precancerous cells). You may have this procedure if: °· You have abnormal bleeding from your cervix. °· You had an abnormal Pap test. °· Something abnormal was seen on your cervix during an exam. ° °This procedure is performed in either a health care provider’s office or in an operating room. °Tell a health care provider about: °· Any allergies you have. °· All medicines you are taking, including vitamins, herbs, eye drops, creams, and over-the-counter medicines. °· Any problems you or family members have had with the use of anesthetic medicines. °· Any blood disorders you have. °· Any surgeries you have had. °· Any medical conditions you have. °· Your smoking habits. °· When you normally have your period. °· Whether you are pregnant or may be pregnant. °What are the risks? °Generally, this is a safe procedure. However, problems may occur, including: °· Heavy bleeding for several days or weeks after the procedure. °· Allergic reactions to medicines or dyes. °· Increased risk of preterm labor in future pregnancies. °· Infection (rare). °· Damage to the cervix or other structures or organs (rare). ° °What happens before the procedure? °Staying hydrated °Follow instructions from your health care provider about hydration, which may include: °· Up to 2 hours before the procedure - you may continue to drink clear liquids, such as water, clear fruit juice, black coffee, and plain tea. ° °Eating and drinking restrictions °Follow instructions from your health care provider about eating and drinking, which may include: °· 8 hours before the procedure - stop eating heavy meals or foods such as meat, fried foods, or fatty foods. °· 6 hours before the procedure - stop eating  light meals or foods, such as toast or cereal. °· 6 hours before the procedure - stop drinking milk or drinks that contain milk. °· 2 hours before the procedure - stop drinking clear liquids. ° °General instructions °· Do not douche, have sex, use tampons, or use any vaginal medicines before the procedure as told by your health care provider. °· You may be asked to empty your bladder and bowel right before the procedure. °· Ask your health care provider about: °? Changing or stopping your normal medicines. This is important if you take diabetes medicines or blood thinners. °? Taking medicines such as aspirin and ibuprofen. These medicines can thin your blood. Do not take these medicines before your procedure if your doctor tells you not to. °· Plan to have someone take you home from the hospital or clinic. °What happens during the procedure? °· To reduce your risk of infection: °? Your health care team will wash or sanitize their hands. °? Your skin will be washed with soap. °? Hair may be removed from the surgical area. °· You will undress from the waist down and be given a gown to wear. °· You will lie on an examining table and put your feet in stirrups. °· An IV tube will be inserted into one of your veins. °· You will be given one or more of the following: °? A medicine to help you relax (sedative). °? A medicine to numb the area (local anesthetic). °? A medicine to make you fall asleep (general anesthetic). °? A medicine that numbs the cervix (cervical block). °·   A lubricated device called a speculum will be inserted into your vagina. It will be used to spread open the walls of the vagina so your health care provider can see the inside of the vagina and cervix better. °· An instrument that has a magnifying lens and a light (colposcope) will let your health care provider examine the cervix more closely. °· Your health care provider will apply a solution to your cervix. This turns abnormal areas a pale  color. °· A tissue sample will be removed from the cervix using one of the following methods: °? The cold knife method. In this method, the tissue is cut out with a knife (scalpel). °? The loop electrosurgical excision procedure (LEEP) method. In this method, the tissue is cut out with a thin wire that can burn (cauterize) the tissue with an electrical current. °? Laser treatment method. In this method, the tissue is cut out and then cauterized with a laser beam to prevent bleeding. °· Your health care provider will apply a paste over the biopsy areas to help control bleeding. °· The tissue sample will be examined under a microscope. °The procedure may vary among health care providers and hospitals. °What happens after the procedure? °· Your blood pressure, heart rate, breathing rate, and blood oxygen level will be monitored often until the medicines you were given have worn off. °· If you were given a local anesthetic, you will rest at the clinic or hospital until you are stable and feel ready to go home. °· If you were given a general anesthetic, you may be monitored for a longer period of time. °· You may have some cramping. °· You may have bloody discharge or light to moderate bleeding. °· You may have dark discharge coming from your vagina. This is from the paste used on the cervix to prevent bleeding. °Summary °· Cervical conization is a procedure in which a cone-shaped portion of the cervix is cut out so that it can be examined under a microscope. °· The procedure is done to check for cancer cells or cells that might turn into cancer (precancerous cells). °This information is not intended to replace advice given to you by your health care provider. Make sure you discuss any questions you have with your health care provider. °Document Released: 07/05/2005 Document Revised: 09/27/2016 Document Reviewed: 09/27/2016 °Elsevier Interactive Patient Education © 2017 [employer] ° °

## 2017-02-08 NOTE — Progress Notes (Signed)
GYNECOLOGY  VISIT   HPI: 61 y.o.   Married  Caucasian  female   G0P0000 with Patient's last menstrual period was 08/09/2010 (approximate).   here for follow up to discuss LEEP procedure.  ASCUS, positive HR HPV.  Husband present for the entire visit today.  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   Had unsatisfactory colposcopy on 02/01/17. Took an anxiolytic so she was not able to have full discussion on the day of her visit.   Pap 07-31-14 Neg:Pos HR HPV--Heg 16/18. Colpo atypia due to atrophic changes.  Colposcopy was unsatisfactory.  No follow up done until her pap this year.   Occasional palpitations.  Can goes months without having them.  Occur more with stress or drinking red wine.   Saw a cardiologist in [place] about 15 years ago.  Had evaluation which was negative.  Told it was herditary and no tx recommended.   GYNECOLOGIC HISTORY: Patient's last menstrual period was 08/09/2010 (approximate). Contraception: Postmenopausal  Menopausal hormone therapy:  none Last mammogram: 01-17-17 Bil.Diag.with Implants & Rt.Br.US--Density C/Neg/BiRads1/screening 6576yr:TBC Last pap smear: 01-15-17 Ascus:Pos HR HPV--colpo revealing atrophic changes;07-31-14 Neg:Pos HR HPV        OB History    Gravida Para Term Preterm AB Living   0 0 0 0 0 0   SAB TAB Ectopic Multiple Live Births   0 0 0 0 0         Patient Active Problem List   Diagnosis Date Noted  . Abdominal pain, epigastric 01/13/2013    Past Medical History:  Diagnosis Date  . DES exposure in utero   . HPV in female   . Postmenopausal state 2012   No HRT  . Thyroid disease 1989   Dr. Juleen ChinaKohut    Past Surgical History:  Procedure Laterality Date  . BREAST ENHANCEMENT SURGERY  2011    Current Outpatient Prescriptions  Medication Sig Dispense Refill  . levothyroxine (SYNTHROID, LEVOTHROID) 125 MCG tablet Take 125 mcg by mouth daily before breakfast.    . valACYclovir (VALTREX) 1000 MG tablet Take 1 tablet (1,000 mg total) by mouth 2  (two) times daily. 10 tablet 1   No current facility-administered medications for this visit.      ALLERGIES: Patient has no known allergies.  Family History  Problem Relation Age of Onset  . Pancreatic cancer Father [id number]  . Alcoholism Father   . Alcoholism Sister   . Diabetes Brother   . Heart failure Maternal Grandfather     elderly    Social History   Social History  . Marital status: Married    Spouse name: N/A  . Number of children: N/A  . Years of education: N/A   Occupational History  . Not on file.   Social History Main Topics  . Smoking status: Former Games developermoker  . Smokeless tobacco: Never Used  . Alcohol use 8.4 oz/week    14 Glasses of wine per week     Comment: about 2 glasses of wine per night  . Drug use: No  . Sexual activity: Yes    Partners: Male    Birth control/ protection: Post-menopausal   Other Topics Concern  . Not on file   Social History Narrative  . No narrative on file    ROS:  Pertinent items are noted in HPI.  PHYSICAL EXAMINATION:    BP 118/72 (BP Location: Right Arm, Patient Position: Sitting, Cuff Size: Normal)   Pulse 80   Ht 5\' 1"  (1.549 m)   Wt 106  lb (48.1 kg)   LMP 08/09/2010 (Approximate)   BMI 20.03 kg/m     General appearance: alert, cooperative and appears stated age Head: Normocephalic, without obvious abnormality, atraumatic Neck: no adenopathy, supple, symmetrical, trachea midline and thyroid normal to inspection and palpation Lungs: clear to auscultation bilaterally Heart: regular rate and rhythm Abdomen: soft, non-tender, no masses,  no organomegaly Extremities: extremities normal, atraumatic, no cyanosis or edema Skin: Skin color, texture, turgor normal. No rashes or lesions   No abnormal inguinal nodes palpated Neurologic: Grossly normal  Pelvic:  Deferred.    ASSESSMENT  ASCUS and positive HR HPV.  Unsatisfactory colposcopy.  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   PLAN  I discussed abnormal paps, HPV, colposcopy  and cervical conization.  I am recommending a LEEP for unsatisfactory colposcopy.  I discussed risk of bleeding, damage to surrounding organs, infection, DVT/PE/death, cervical scarring, and potential need for further treatment.  Would like to avoid the first week of June for surgical care.  Written information on cervical conization to patient.    An After Visit Summary was printed and given to the patient.  ___15___ minutes face to face time of which over 50% was spent in counseling.

## 2017-02-21 ENCOUNTER — Telehealth: Payer: Self-pay | Admitting: Obstetrics and Gynecology

## 2017-02-21 NOTE — Telephone Encounter (Signed)
Spoke with pt regarding benefit for recommended surgery. Patient understood and agreeable. Patient aware this is professional benefit only. Patient aware, once surgery has been scheduled, she will be contacted by hospital for separate benefits. Patient advises she will call back to confirm and proceed with scheduling.   cc: Billie RuddySally Yeakley

## 2017-03-15 NOTE — Telephone Encounter (Signed)
Phone note on 03/15/17 states patient is requesting to have procedure performed in the office, and not on an out-patient basis, as previously pre-certified. Will forward to provider, as well as the nurse supervisor to review and advise how to proceed.  Routing to Dr Edward JollySilva  cc: Billie RuddySally Yeakley

## 2017-03-15 NOTE — Telephone Encounter (Signed)
Call to patient to discuss LEEP scheduling. Left message to call back.

## 2017-03-15 NOTE — Telephone Encounter (Signed)
Patient wants to speak with you about scheduling procedure in office and get the benefits for that.

## 2017-03-15 NOTE — Telephone Encounter (Signed)
I do not recommend the LEEP procedure in the office setting.  Patient has had significant difficulty with the colposcopy each time. The LEEP will involve much more to complete this procedure.  It is about safety and adequacy in performing the procedure as much as it is about her comfort.   Cc- Harland DingwallSuzy Dixon

## 2017-03-19 NOTE — Telephone Encounter (Signed)
Call to patient regarding LEEP procedure. Advised of Dr Rica RecordsSilva's recommendations. Patient very insistent that she can "suck it up" for 10 minutes as it is unfair for her to incur hospital charges for an in-office procedure.  Support given regarding cost but assured this is not uncommon and is not simply about her comfort but also safety and adequacy of procedure as it is much more involved than colpo. Discussed that many patients need hospital procedure for varying reasons. Patient requesting estimate of hospital cost. Discussed option of speaking to pre-service center at hospital. Becky Price is in a store and cant write down any information. States she will call me back tomorrow for this information.

## 2017-04-10 NOTE — Telephone Encounter (Signed)
Call to patient regarding LEEP procedure recommended by Dr Edward JollySilva. Patient states she has been sick with GI pain for about 3 weeks and finally went to [facility] urgent [facility] clinic. Was told may be diverticulitis but no labs or imaging done. Was given antibiotics and other meds. States she had eaten salmon and romaine salad just before onset of pain and feels she had "bacterial infection" that resolved within two days of antibiotics. No further issues since.  Given phone number to pre-services center and CPT codes to check on cost of procedure at hospital. Patient to call back to let me know is agreeable to proceed. Discussed date of 04-23-17 and 05-07-17. Patient does not think she will be ready for 04-23-17 since too soon, will aim for 05-07-17. She will call back to let me know.  Routing to Dr Edward JollySilva for final review.

## 2017-04-18 ENCOUNTER — Telehealth: Payer: Self-pay | Admitting: Obstetrics and Gynecology

## 2017-04-18 NOTE — Telephone Encounter (Signed)
Return call to patient. Left message to call back. Per ROI, can leave message on cell number.  Per previous phone encounter, discussed surgery date of 05-07-17. Left message to call back if desires to proceed with this date.

## 2017-04-18 NOTE — Telephone Encounter (Signed)
Patient is returning a call to schedule surgery

## 2017-04-18 NOTE — Telephone Encounter (Signed)
See phone encounter for 04-18-17 regarding surgery scheduling.   Encounter closed.

## 2017-04-19 NOTE — Telephone Encounter (Signed)
Patient called to confirmed the surgery date of 05/07/17 will work for her and she is ready to proceed.

## 2017-04-19 NOTE — Telephone Encounter (Signed)
Case request sent to central scheduling for 05-07-17.

## 2017-04-20 NOTE — Telephone Encounter (Signed)
Call to patient. Left message confirming procedure scheduled for 05-07-17 as requested. Left message to call back to review surgical instructions and schedule appointments.

## 2017-04-23 ENCOUNTER — Encounter (HOSPITAL_COMMUNITY): Payer: Self-pay | Admitting: *Deleted

## 2017-04-23 NOTE — Telephone Encounter (Signed)
Spoke with patient. Advised procedure has been scheduled for 05/07/2017. Follow up appointment scheduled for 05/14/17 at 11:30 am with Dr.Silva. Patient is agreeable to date and time. Aware Billie RuddySally Yeakley, RN will return call upon return to the office to review pre-procedure instructions.

## 2017-04-24 NOTE — Telephone Encounter (Signed)
Call to patient. Left message calling to complet surgery instructions. Left message to call back.

## 2017-04-26 NOTE — Telephone Encounter (Signed)
Return call to patient. Left message to call back. [facility]. Needs surgical instructions and pre and post op appointments.

## 2017-04-26 NOTE — Telephone Encounter (Signed)
Patient returned call

## 2017-04-27 NOTE — Telephone Encounter (Signed)
Return call from patient. Surgery instruction sheet reviewed and printed copy will be provided at surgery consult provided on Monday. Patient with some confusion regarding extent of procedure of cervical dilation versus actual removal of abnormal cells. Brief discussion of LEEP procedure and advised Dr Edward JollySilva will discuss this with her further on Monday.  Routing to provider for final review. Patient agreeable to disposition. Will close encounter.

## 2017-04-30 ENCOUNTER — Ambulatory Visit (INDEPENDENT_AMBULATORY_CARE_PROVIDER_SITE_OTHER): Payer: Managed Care, Other (non HMO) | Admitting: Obstetrics and Gynecology

## 2017-04-30 ENCOUNTER — Other Ambulatory Visit: Payer: Self-pay | Admitting: Obstetrics and Gynecology

## 2017-04-30 ENCOUNTER — Encounter: Payer: Self-pay | Admitting: Obstetrics and Gynecology

## 2017-04-30 VITALS — BP 116/60 | HR 68 | Resp 16 | Wt 110.0 lb

## 2017-04-30 DIAGNOSIS — R87810 Cervical high risk human papillomavirus (HPV) DNA test positive: Secondary | ICD-10-CM | POA: Diagnosis not present

## 2017-04-30 DIAGNOSIS — R87610 Atypical squamous cells of undetermined significance on cytologic smear of cervix (ASC-US): Secondary | ICD-10-CM | POA: Diagnosis not present

## 2017-04-30 DIAGNOSIS — R5383 Other fatigue: Secondary | ICD-10-CM | POA: Diagnosis not present

## 2017-04-30 NOTE — Patient Instructions (Signed)
Cervical Conization °Cervical conization (cone biopsy) is a procedure in which a cone-shaped portion of the cervix is cut out so that it can be examined under a microscope. The procedure is done to check for cancer cells or cells that might turn into cancer (precancerous cells). You may have this procedure if: °· You have abnormal bleeding from your cervix. °· You had an abnormal Pap test. °· Something abnormal was seen on your cervix during an exam. ° °This procedure is performed in either a health care provider’s office or in an operating room. °Tell a health care provider about: °· Any allergies you have. °· All medicines you are taking, including vitamins, herbs, eye drops, creams, and over-the-counter medicines. °· Any problems you or family members have had with the use of anesthetic medicines. °· Any blood disorders you have. °· Any surgeries you have had. °· Any medical conditions you have. °· Your smoking habits. °· When you normally have your period. °· Whether you are pregnant or may be pregnant. °What are the risks? °Generally, this is a safe procedure. However, problems may occur, including: °· Heavy bleeding for several days or weeks after the procedure. °· Allergic reactions to medicines or dyes. °· Increased risk of preterm labor in future pregnancies. °· Infection (rare). °· Damage to the cervix or other structures or organs (rare). ° °What happens before the procedure? °Staying hydrated °Follow instructions from your health care provider about hydration, which may include: °· Up to 2 hours before the procedure - you may continue to drink clear liquids, such as water, clear fruit juice, black coffee, and plain tea. ° °Eating and drinking restrictions °Follow instructions from your health care provider about eating and drinking, which may include: °· 8 hours before the procedure - stop eating heavy meals or foods such as meat, fried foods, or fatty foods. °· 6 hours before the procedure - stop eating  light meals or foods, such as toast or cereal. °· 6 hours before the procedure - stop drinking milk or drinks that contain milk. °· 2 hours before the procedure - stop drinking clear liquids. ° °General instructions °· Do not douche, have sex, use tampons, or use any vaginal medicines before the procedure as told by your health care provider. °· You may be asked to empty your bladder and bowel right before the procedure. °· Ask your health care provider about: °? Changing or stopping your normal medicines. This is important if you take diabetes medicines or blood thinners. °? Taking medicines such as aspirin and ibuprofen. These medicines can thin your blood. Do not take these medicines before your procedure if your doctor tells you not to. °· Plan to have someone take you home from the hospital or clinic. °What happens during the procedure? °· To reduce your risk of infection: °? Your health care team will wash or sanitize their hands. °? Your skin will be washed with soap. °? Hair may be removed from the surgical area. °· You will undress from the waist down and be given a gown to wear. °· You will lie on an examining table and put your feet in stirrups. °· An IV tube will be inserted into one of your veins. °· You will be given one or more of the following: °? A medicine to help you relax (sedative). °? A medicine to numb the area (local anesthetic). °? A medicine to make you fall asleep (general anesthetic). °? A medicine that numbs the cervix (cervical block). °·   A lubricated device called a speculum will be inserted into your vagina. It will be used to spread open the walls of the vagina so your health care provider can see the inside of the vagina and cervix better. °· An instrument that has a magnifying lens and a light (colposcope) will let your health care provider examine the cervix more closely. °· Your health care provider will apply a solution to your cervix. This turns abnormal areas a pale  color. °· A tissue sample will be removed from the cervix using one of the following methods: °? The cold knife method. In this method, the tissue is cut out with a knife (scalpel). °? The loop electrosurgical excision procedure (LEEP) method. In this method, the tissue is cut out with a thin wire that can burn (cauterize) the tissue with an electrical current. °? Laser treatment method. In this method, the tissue is cut out and then cauterized with a laser beam to prevent bleeding. °· Your health care provider will apply a paste over the biopsy areas to help control bleeding. °· The tissue sample will be examined under a microscope. °The procedure may vary among health care providers and hospitals. °What happens after the procedure? °· Your blood pressure, heart rate, breathing rate, and blood oxygen level will be monitored often until the medicines you were given have worn off. °· If you were given a local anesthetic, you will rest at the clinic or hospital until you are stable and feel ready to go home. °· If you were given a general anesthetic, you may be monitored for a longer period of time. °· You may have some cramping. °· You may have bloody discharge or light to moderate bleeding. °· You may have dark discharge coming from your vagina. This is from the paste used on the cervix to prevent bleeding. °Summary °· Cervical conization is a procedure in which a cone-shaped portion of the cervix is cut out so that it can be examined under a microscope. °· The procedure is done to check for cancer cells or cells that might turn into cancer (precancerous cells). °This information is not intended to replace advice given to you by your health care provider. Make sure you discuss any questions you have with your health care provider. °Document Released: 07/05/2005 Document Revised: 09/27/2016 Document Reviewed: 09/27/2016 °Elsevier Interactive Patient Education © 2017 [employer] ° °

## 2017-04-30 NOTE — Progress Notes (Signed)
GYNECOLOGY  VISIT   HPI: 61 y.o.   Married  Caucasian  female   G0P0000 with Patient's last menstrual period was 08/09/2010 (approximate).   here for surgery consult for LEEP procedure.   Last pap on 01/15/17 - ASCUS, positive HR HPV.  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   Had unsatisfactory colposcopy on 02/01/17.   Pap 07-31-14 Neg:Pos HR HPV--Heg 16/18. Colpo atypia due to atrophic changes. Colposcopy was unsatisfactory.  No follow up done until her pap this year.   Occasional palpitations.  Can goes months without having them.  Occur more with stress or drinking red wine.   Saw a cardiologist in [place] about 15 years ago.  Had evaluation which was negative.  Told it was herditary and no tx recommended.  States she is treating her dog with an antibiotic, chloramphenicol,  that causes anemia.  She states she needs to use gloves when she administers it.   She states some fatigue.  Last TSH was checked. TSH was normal at the begining of this year.   States she had diarrhea and abdominal pain following her last visit here.  She was treated with 2 antibiotics, and told she probably had diverticulitis.  GYNECOLOGIC HISTORY: Patient's last menstrual period was 08/09/2010 (approximate). Contraception:  Postmenopausal Menopausal hormone therapy:  none Last mammogram:  01-17-17 Bil.Diag.with Implants & Rt.Br.US--Density C/Neg/BiRads1/screening 73yr:TBC Last pap smear:   01-15-17 Ascus:Pos HR HPV--colpo revealing atrophic changes;07-31-14 Neg:Pos HR HPV        OB History    Gravida Para Term Preterm AB Living   0 0 0 0 0 0   SAB TAB Ectopic Multiple Live Births   0 0 0 0 0         Patient Active Problem List   Diagnosis Date Noted  . Abdominal pain, epigastric 01/13/2013    Past Medical History:  Diagnosis Date  . DES exposure in utero   . History of palpitations    red wine triggers palpitations along with stress/anxiety  . HPV in female   . Hypothyroidism   . Postmenopausal state  2012   No HRT  . Thyroid disease 1989   Dr. Juleen China    Past Surgical History:  Procedure Laterality Date  . BREAST ENHANCEMENT SURGERY  2011  . BREAST SURGERY    . COLONOSCOPY    . WISDOM TOOTH EXTRACTION      Current Outpatient Prescriptions  Medication Sig Dispense Refill  . levothyroxine (SYNTHROID, LEVOTHROID) 125 MCG tablet Take 125 mcg by mouth daily before breakfast.    . valACYclovir (VALTREX) 1000 MG tablet Take 1 tablet (1,000 mg total) by mouth 2 (two) times daily. (Patient not taking: Reported on 04/30/2017) 10 tablet 1   No current facility-administered medications for this visit.      ALLERGIES: Patient has no known allergies.  Family History  Problem Relation Age of Onset  . Pancreatic cancer Father 31  . Alcoholism Father   . Alcoholism Sister   . Diabetes Brother   . Heart failure Maternal Grandfather        elderly    Social History   Social History  . Marital status: Married    Spouse name: N/A  . Number of children: N/A  . Years of education: N/A   Occupational History  . Not on file.   Social History Main Topics  . Smoking status: Former Smoker    Packs/day: 0.50    Years: 10.00    Types: Cigarettes    Quit date:  08/09/1992  . Smokeless tobacco: Never Used  . Alcohol use 4.2 oz/week    7 Glasses of wine per week     Comment: about 1 glasses of wine per night  . Drug use: No  . Sexual activity: Yes    Partners: Male    Birth control/ protection: Post-menopausal   Other Topics Concern  . Not on file   Social History Narrative  . No narrative on file    ROS:  Pertinent items are noted in HPI.  PHYSICAL EXAMINATION:    BP 116/60 (BP Location: Right Arm, Patient Position: Sitting, Cuff Size: Normal)   Pulse 68   Resp 16   Wt 110 lb (49.9 kg)   LMP 08/09/2010 (Approximate)   BMI 20.78 kg/m     General appearance: alert, cooperative and appears stated age Head: Normocephalic, without obvious abnormality, atraumatic Neck: no  adenopathy, supple, symmetrical, trachea midline and thyroid normal to inspection and palpation Lungs: clear to auscultation bilaterally Heart: regular rate and rhythm Abdomen: soft, non-tender, no masses,  no organomegaly Extremities: extremities normal, atraumatic, no cyanosis or edema Skin: Skin color, texture, turgor normal. No rashes or lesions Lymph nodes: Cervical, supraclavicular, and axillary nodes normal. No abnormal inguinal nodes palpated Neurologic: Grossly normal  Pelvic: External genitalia:  no lesions              Urethra:  normal appearing urethra with no masses, tenderness or lesions              Bartholins and Skenes: normal                 Vagina: normal appearing vagina with normal color and discharge, no lesions              Cervix: no lesions                Bimanual Exam:  Uterus:  normal size, contour, position, consistency, mobility, non-tender              Adnexa: no mass, fullness, tenderness               Chaperone was present for exam.  ASSESSMENT  ASCUS pap and positive HR HPV.  Unsatisfactory colposcopy.  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  Fatigue.  Concerned about potential anemia.   PLAN  Discussion of HPV, abnormal paps, colposcopy, and LEEP.  Discussed LEEP. Discussed risks of bleeding, infection, damage to surrounding organs, reactions to anesthesia, DVT/PE, death, and inability to extract HPV from her tissues. Patient wishes to proceed.  CBC.    An After Visit Summary was printed and given to the patient.  ___25___ minutes face to face time of which over 50% was spent in counseling.

## 2017-05-01 LAB — CBC WITH DIFFERENTIAL/PLATELET
BASOS: 0 %
Basophils Absolute: 0 10*3/uL (ref 0.0–0.2)
EOS (ABSOLUTE): 0.1 10*3/uL (ref 0.0–0.4)
EOS: 2 %
HEMATOCRIT: 39.1 % (ref 34.0–46.6)
Hemoglobin: 12.9 g/dL (ref 11.1–15.9)
IMMATURE GRANS (ABS): 0 10*3/uL (ref 0.0–0.1)
IMMATURE GRANULOCYTES: 0 %
Lymphocytes Absolute: 3 10*3/uL (ref 0.7–3.1)
Lymphs: 51 %
MCH: 31.9 pg (ref 26.6–33.0)
MCHC: 33 g/dL (ref 31.5–35.7)
MCV: 97 fL (ref 79–97)
Monocytes Absolute: 0.5 10*3/uL (ref 0.1–0.9)
Monocytes: 9 %
NEUTROS PCT: 38 %
Neutrophils Absolute: 2.2 10*3/uL (ref 1.4–7.0)
Platelets: 198 10*3/uL (ref 150–379)
RBC: 4.04 x10E6/uL (ref 3.77–5.28)
RDW: 14.2 % (ref 12.3–15.4)
WBC: 5.8 10*3/uL (ref 3.4–10.8)

## 2017-05-02 ENCOUNTER — Telehealth: Payer: Self-pay

## 2017-05-02 NOTE — Telephone Encounter (Signed)
-----   Message from Patton SallesBrook E Amundson C Silva, MD sent at 05/02/2017  6:22 AM EDT ----- Please report normal CBC to patient.  There is no sign of anemia.

## 2017-05-02 NOTE — Telephone Encounter (Signed)
Spoke with patient. Results given. Patient verbalizes understanding. 

## 2017-05-06 NOTE — H&P (Signed)
Office Visit   [facility]30/2017 [facility]  Becky IsaacsAmundson C Silva, Forrestine HimBrook E, MD  Obstetrics and Gynecology   Atypical squamous cell changes of undetermined significance (ASCUS) on cervical cytology with positive high risk human papilloma virus (HPV) +1 more  Dx   Office Visit   Reason for Visit   Additional Documentation   Vitals:   BP 116/60 (BP Location: Right Arm, Patient Position: Sitting, Cuff Size: Normal)   Pulse 68   Resp 16   Wt 110 lb (49.9 kg)   LMP 08/09/2010 (Approximate)   BMI 20.78 kg/m   BSA 1.47 m   Flowsheets:   Infectious Disease Screening,   Custom Formula Data,   MEWS Score,   Anthropometrics     Encounter Info:   Billing Info,   History,   Allergies,   Detailed Report     All Notes   Progress Notes by Patton SallesAmundson C Silva, Asuna Peth E, MD at 04/30/2017 2:30 PM   Author: Patton SallesAmundson C Silva, Kendyll Huettner E, MD Author Type: Physician Filed: 04/30/2017 3:21 PM  Note Status: Signed Cosign: Cosign Not Required Encounter Date: 04/30/2017  Editor: Patton SallesAmundson C Silva, Tonimarie Gritz E, MD (Physician)  Prior Versions: 1. Lum BabeMitchell, Taylor A, CMA (Certified Engineer, siteMedical Assistant) at 04/30/2017 2:42 PM - Sign at close encounter    GYNECOLOGY  VISIT   HPI: 61 y.o.   Married  Caucasian  female   G0P0000 with Patient's last menstrual period was 08/09/2010 (approximate).   here for surgery consult for LEEP procedure.   Last pap on 01/15/17 - ASCUS, positive HR HPV.  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   Had unsatisfactory colposcopy on 02/01/17.   Pap 07-31-14 Neg:Pos HR HPV--Heg 16/18. Colpo atypia due to atrophic changes. Colposcopy was unsatisfactory.  No follow up done until her pap this year.   Occasional palpitations.  Can goes months without having them.  Occur more with stress or drinking red wine.   [place].  Had evaluation which was negative.  Told it was herditary and no tx recommended.  States she is treating her dog with an  antibiotic, chloramphenicol,  that causes anemia.  She states she needs to use gloves when she administers it.   She states some fatigue.  Last TSH was checked. TSH was normal at the begining of this year.   States she had diarrhea and abdominal pain following her last visit here.  She was treated with 2 antibiotics, and told she probably had diverticulitis.  GYNECOLOGIC HISTORY: Patient's last menstrual period was 08/09/2010 (approximate). Contraception:  Postmenopausal Menopausal hormone therapy:  none Last mammogram:  01-17-17 Bil.Diag.with Implants & Rt.Br.US--Density C/Neg/BiRads1/screening 4367yr:TBC Last pap smear:   01-15-17 Ascus:Pos HR HPV--colpo revealing atrophic changes;07-31-14 Neg:Pos HR HPV                OB History    Gravida Para Term Preterm AB Living   0 0 0 0 0 0   SAB TAB Ectopic Multiple Live Births   0 0 0 0 0             Patient Active Problem List   Diagnosis Date Noted  . Abdominal pain, epigastric 01/13/2013        Past Medical History:  Diagnosis Date  . DES exposure in utero   . History of palpitations    red wine triggers palpitations along with stress/anxiety  . HPV in female   . Hypothyroidism   . Postmenopausal state 2012  No HRT  . Thyroid disease 1989   Dr. Juleen ChinaKohut         Past Surgical History:  Procedure Laterality Date  . BREAST ENHANCEMENT SURGERY  2011  . BREAST SURGERY    . COLONOSCOPY    . WISDOM TOOTH EXTRACTION            Current Outpatient Prescriptions  Medication Sig Dispense Refill  . levothyroxine (SYNTHROID, LEVOTHROID) 125 MCG tablet Take 125 mcg by mouth daily before breakfast.    . valACYclovir (VALTREX) 1000 MG tablet Take 1 tablet (1,000 mg total) by mouth 2 (two) times daily. (Patient not taking: Reported on 04/30/2017) 10 tablet 1   No current facility-administered medications for this visit.      ALLERGIES: Patient has no known allergies.       Family History    Problem Relation Age of Onset  . Pancreatic cancer Father 3051  . Alcoholism Father   . Alcoholism Sister   . Diabetes Brother   . Heart failure Maternal Grandfather        elderly    Social History        Social History  . Marital status: Married    Spouse name: N/A  . Number of children: N/A  . Years of education: N/A      Occupational History  . Not on file.         Social History Main Topics  . Smoking status: Former Smoker    Packs/day: 0.50    Years: 10.00    Types: Cigarettes    Quit date: 08/09/1992  . Smokeless tobacco: Never Used  . Alcohol use 4.2 oz/week    7 Glasses of wine per week     Comment: about 1 glasses of wine per night  . Drug use: No  . Sexual activity: Yes    Partners: Male    Birth control/ protection: Post-menopausal       Other Topics Concern  . Not on file      Social History Narrative  . No narrative on file    ROS:  Pertinent items are noted in HPI.  PHYSICAL EXAMINATION:    BP 116/60 (BP Location: Right Arm, Patient Position: Sitting, Cuff Size: Normal)   Pulse 68   Resp 16   Wt 110 lb (49.9 kg)   LMP 08/09/2010 (Approximate)   BMI 20.78 kg/m     General appearance: alert, cooperative and appears stated age Head: Normocephalic, without obvious abnormality, atraumatic Neck: no adenopathy, supple, symmetrical, trachea midline and thyroid normal to inspection and palpation Lungs: clear to auscultation bilaterally Heart: regular rate and rhythm Abdomen: soft, non-tender, no masses,  no organomegaly Extremities: extremities normal, atraumatic, no cyanosis or edema Skin: Skin color, texture, turgor normal. No rashes or lesions Lymph nodes: Cervical, supraclavicular, and axillary nodes normal. No abnormal inguinal nodes palpated Neurologic: Grossly normal  Pelvic: External genitalia:  no lesions              Urethra:  normal appearing urethra with no masses, tenderness or lesions               Bartholins and Skenes: normal                 Vagina: normal appearing vagina with normal color and discharge, no lesions              Cervix: no lesions  Bimanual Exam:  Uterus:  normal size, contour, position, consistency, mobility, non-tender              Adnexa: no mass, fullness, tenderness               Chaperone was present for exam.  ASSESSMENT  ASCUS pap and positive HR HPV.  Unsatisfactory colposcopy.  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  Fatigue.  Concerned about potential anemia.   PLAN  Discussion of HPV, abnormal paps, colposcopy, and LEEP.  Discussed LEEP. Discussed risks of bleeding, infection, damage to surrounding organs, reactions to anesthesia, DVT/PE, death, and inability to extract HPV from her tissues. Patient wishes to proceed.  CBC.    An After Visit Summary was printed and given to the patient.  ___25___ minutes face to face time of which over 50% was spent in counseling.

## 2017-05-07 ENCOUNTER — Ambulatory Visit (HOSPITAL_COMMUNITY)
Admission: RE | Admit: 2017-05-07 | Discharge: 2017-05-07 | Disposition: A | Discharge status: Home / Self Care | Payer: Managed Care, Other (non HMO) | Source: Ambulatory Visit | Attending: Obstetrics and Gynecology | Admitting: Obstetrics and Gynecology

## 2017-05-07 ENCOUNTER — Ambulatory Visit (HOSPITAL_COMMUNITY): Payer: Managed Care, Other (non HMO) | Admitting: Anesthesiology

## 2017-05-07 ENCOUNTER — Encounter (HOSPITAL_COMMUNITY)
Admission: RE | Disposition: A | Discharge status: Home / Self Care | Payer: Self-pay | Source: Ambulatory Visit | Attending: Obstetrics and Gynecology

## 2017-05-07 DIAGNOSIS — Z87891 Personal history of nicotine dependence: Secondary | ICD-10-CM | POA: Diagnosis not present

## 2017-05-07 DIAGNOSIS — Z779 Other contact with and (suspected) exposures hazardous to health: Secondary | ICD-10-CM | POA: Insufficient documentation

## 2017-05-07 DIAGNOSIS — R87810 Cervical high risk human papillomavirus (HPV) DNA test positive: Secondary | ICD-10-CM

## 2017-05-07 DIAGNOSIS — E039 Hypothyroidism, unspecified: Secondary | ICD-10-CM | POA: Diagnosis not present

## 2017-05-07 DIAGNOSIS — R87610 Atypical squamous cells of undetermined significance on cytologic smear of cervix (ASC-US): Secondary | ICD-10-CM | POA: Insufficient documentation

## 2017-05-07 DIAGNOSIS — Z79899 Other long term (current) drug therapy: Secondary | ICD-10-CM | POA: Insufficient documentation

## 2017-05-07 HISTORY — DX: Personal history of other specified conditions: Z87.898

## 2017-05-07 HISTORY — DX: Hypothyroidism, unspecified: E03.9

## 2017-05-07 HISTORY — PX: LEEP: SHX91

## 2017-05-07 LAB — CBC
HEMATOCRIT: 39.5 % (ref 36.0–46.0)
Hemoglobin: 13.6 g/dL (ref 12.0–15.0)
MCH: 32.6 pg (ref 26.0–34.0)
MCHC: 34.4 g/dL (ref 30.0–36.0)
MCV: 94.7 fL (ref 78.0–100.0)
Platelets: 194 10*3/uL (ref 150–400)
RBC: 4.17 MIL/uL (ref 3.87–5.11)
RDW: 13.2 % (ref 11.5–15.5)
WBC: 4.4 10*3/uL (ref 4.0–10.5)

## 2017-05-07 SURGERY — LEEP (LOOP ELECTROSURGICAL EXCISION PROCEDURE)
Anesthesia: General | Site: Vagina

## 2017-05-07 MED ORDER — DEXAMETHASONE SODIUM PHOSPHATE 4 MG/ML IJ SOLN
INTRAMUSCULAR | Status: AC
  Filled 2017-05-07: qty 1

## 2017-05-07 MED ORDER — OXYCODONE HCL 5 MG/5ML PO SOLN: 5.0000 mg | Freq: Once | ORAL | Status: DC | PRN

## 2017-05-07 MED ORDER — LIDOCAINE HCL (CARDIAC) 20 MG/ML IV SOLN
INTRAVENOUS | Status: AC
  Filled 2017-05-07: qty 5

## 2017-05-07 MED ORDER — OXYCODONE HCL 5 MG PO TABS: 5.0000 mg | ORAL_TABLET | Freq: Once | ORAL | Status: DC | PRN

## 2017-05-07 MED ORDER — FERRIC SUBSULFATE 259 MG/GM EX SOLN
CUTANEOUS | Status: AC
  Filled 2017-05-07: qty 8

## 2017-05-07 MED ORDER — PROPOFOL 10 MG/ML IV BOLUS
INTRAVENOUS | Status: AC
  Filled 2017-05-07: qty 20

## 2017-05-07 MED ORDER — DEXAMETHASONE SODIUM PHOSPHATE 4 MG/ML IJ SOLN
INTRAMUSCULAR | Status: DC | PRN
  Administered 2017-05-07: 4 mg via INTRAVENOUS

## 2017-05-07 MED ORDER — LACTATED RINGERS IV SOLN
INTRAVENOUS | Status: DC
  Administered 2017-05-07: 09:00:00 via INTRAVENOUS
  Administered 2017-05-07: 125 mL/h via INTRAVENOUS

## 2017-05-07 MED ORDER — PROPOFOL 10 MG/ML IV BOLUS
INTRAVENOUS | Status: DC | PRN
  Administered 2017-05-07: 150 mg via INTRAVENOUS

## 2017-05-07 MED ORDER — FENTANYL CITRATE (PF) 100 MCG/2ML IJ SOLN
INTRAMUSCULAR | Status: DC | PRN
  Administered 2017-05-07 (×2): 50 ug via INTRAVENOUS

## 2017-05-07 MED ORDER — MIDAZOLAM HCL 2 MG/2ML IJ SOLN
INTRAMUSCULAR | Status: AC
  Filled 2017-05-07: qty 2

## 2017-05-07 MED ORDER — GLYCOPYRROLATE 0.2 MG/ML IJ SOLN
INTRAMUSCULAR | Status: DC | PRN
  Administered 2017-05-07: 0.2 mg via INTRAVENOUS

## 2017-05-07 MED ORDER — GLYCOPYRROLATE 0.2 MG/ML IJ SOLN
INTRAMUSCULAR | Status: AC
  Filled 2017-05-07: qty 1

## 2017-05-07 MED ORDER — IBUPROFEN 800 MG PO TABS: 800.0000 mg | ORAL_TABLET | Freq: Three times a day (TID) | ORAL | 0 refills | Status: DC | PRN

## 2017-05-07 MED ORDER — LIDOCAINE-EPINEPHRINE (PF) 1 %-1:200000 IJ SOLN
INTRAMUSCULAR | Status: AC
  Filled 2017-05-07: qty 30

## 2017-05-07 MED ORDER — HYDROMORPHONE HCL 1 MG/ML IJ SOLN: 0.2500 mg | INTRAMUSCULAR | Status: DC | PRN

## 2017-05-07 MED ORDER — SCOPOLAMINE 1 MG/3DAYS TD PT72
MEDICATED_PATCH | TRANSDERMAL | Status: AC
  Administered 2017-05-07: 1.5 mg via TRANSDERMAL
  Filled 2017-05-07: qty 1

## 2017-05-07 MED ORDER — IODINE STRONG (LUGOLS) 5 % PO SOLN
ORAL | Status: DC | PRN
  Administered 2017-05-07: 0.1 mL via ORAL

## 2017-05-07 MED ORDER — KETOROLAC TROMETHAMINE 30 MG/ML IJ SOLN
INTRAMUSCULAR | Status: AC
  Filled 2017-05-07: qty 1

## 2017-05-07 MED ORDER — SCOPOLAMINE 1 MG/3DAYS TD PT72
1.0000 | MEDICATED_PATCH | Freq: Once | TRANSDERMAL | Status: DC
Start: 2017-05-07 — End: 2017-05-07
  Administered 2017-05-07: 1.5 mg via TRANSDERMAL

## 2017-05-07 MED ORDER — ACETIC ACID 4% SOLUTION
Status: DC | PRN
  Administered 2017-05-07: 1 via TOPICAL

## 2017-05-07 MED ORDER — LIDOCAINE-EPINEPHRINE (PF) 1 %-1:200000 IJ SOLN
INTRAMUSCULAR | Status: DC | PRN
  Administered 2017-05-07: 8 mL

## 2017-05-07 MED ORDER — ONDANSETRON HCL 4 MG/2ML IJ SOLN
INTRAMUSCULAR | Status: DC | PRN
  Administered 2017-05-07: 4 mg via INTRAVENOUS

## 2017-05-07 MED ORDER — MIDAZOLAM HCL 2 MG/2ML IJ SOLN
INTRAMUSCULAR | Status: DC | PRN
  Administered 2017-05-07: 1 mg via INTRAVENOUS

## 2017-05-07 MED ORDER — IODINE STRONG (LUGOLS) 5 % PO SOLN
ORAL | Status: AC
  Filled 2017-05-07: qty 1

## 2017-05-07 MED ORDER — ACETIC ACID 5 % SOLN
Status: AC
  Filled 2017-05-07: qty 500

## 2017-05-07 MED ORDER — FERRIC SUBSULFATE SOLN
Status: DC | PRN
  Administered 2017-05-07: 1 via TOPICAL

## 2017-05-07 MED ORDER — KETOROLAC TROMETHAMINE 30 MG/ML IJ SOLN
INTRAMUSCULAR | Status: DC | PRN
  Administered 2017-05-07: 30 mg via INTRAVENOUS

## 2017-05-07 MED ORDER — ONDANSETRON HCL 4 MG/2ML IJ SOLN
INTRAMUSCULAR | Status: AC
  Filled 2017-05-07: qty 2

## 2017-05-07 MED ORDER — LIDOCAINE HCL (CARDIAC) 20 MG/ML IV SOLN
INTRAVENOUS | Status: DC | PRN
  Administered 2017-05-07: 40 mg via INTRAVENOUS

## 2017-05-07 MED ORDER — PROMETHAZINE HCL 25 MG/ML IJ SOLN: 6.2500 mg | INTRAMUSCULAR | Status: DC | PRN

## 2017-05-07 MED ORDER — DEXAMETHASONE SODIUM PHOSPHATE 10 MG/ML IJ SOLN
INTRAMUSCULAR | Status: AC
  Filled 2017-05-07: qty 1

## 2017-05-07 MED ORDER — FENTANYL CITRATE (PF) 100 MCG/2ML IJ SOLN
INTRAMUSCULAR | Status: AC
  Filled 2017-05-07: qty 2

## 2017-05-07 SURGICAL SUPPLY — 26 items
APPLICATOR COTTON TIP 6IN STRL (MISCELLANEOUS) IMPLANT
CLOTH BEACON ORANGE TIMEOUT ST (SAFETY) ×3 IMPLANT
COUNTER NEEDLE 1200 MAGNETIC (NEEDLE) IMPLANT
ELECT BALL LEEP 3MM BLK (ELECTRODE) IMPLANT
ELECT BALL LEEP 5MM RED (ELECTRODE) IMPLANT
ELECT LOOP LEEP RND 15X12 GRN (CUTTING LOOP)
ELECT LOOP LEEP RND 20X12 WHT (CUTTING LOOP)
ELECT LOOP LEEP SQR 10X10 ORG (CUTTING LOOP) ×3
ELECT REM PT RETURN 9FT ADLT (ELECTROSURGICAL) ×3
ELECTRODE LOOP LP RND 15X12GRN (CUTTING LOOP) IMPLANT
ELECTRODE LOOP LP RND 20X12WHT (CUTTING LOOP) IMPLANT
ELECTRODE LOOP LP SQR 10X10ORG (CUTTING LOOP) ×1 IMPLANT
ELECTRODE REM PT RTRN 9FT ADLT (ELECTROSURGICAL) ×1 IMPLANT
EXTENDER ELECT LOOP LEEP 10CM (CUTTING LOOP) IMPLANT
GLOVE BIO SURGEON STRL SZ 6.5 (GLOVE) ×2 IMPLANT
GLOVE BIO SURGEONS STRL SZ 6.5 (GLOVE) ×1
GLOVE BIOGEL PI IND STRL 7.0 (GLOVE) ×1 IMPLANT
GLOVE BIOGEL PI INDICATOR 7.0 (GLOVE) ×2
GOWN STRL REUS W/TWL LRG LVL3 (GOWN DISPOSABLE) ×6 IMPLANT
NS IRRIG 1000ML POUR BTL (IV SOLUTION) ×3 IMPLANT
PACK VAGINAL MINOR WOMEN LF (CUSTOM PROCEDURE TRAY) ×3 IMPLANT
PAD OB MATERNITY 4.3X12.25 (PERSONAL CARE ITEMS) ×3 IMPLANT
SCOPETTES 8  STERILE (MISCELLANEOUS) ×4
SCOPETTES 8 STERILE (MISCELLANEOUS) ×2 IMPLANT
SUT SILK 2 0 SH (SUTURE) IMPLANT
TOWEL OR 17X24 6PK STRL BLUE (TOWEL DISPOSABLE) ×6 IMPLANT

## 2017-05-07 NOTE — Op Note (Signed)
NAME:  Becky Price, Becky Price               ACCOUNT NO.:  [id number]  MEDICAL RECORD NO.:  [id number]  LOCATION:                                 FACILITY:  PHYSICIAN:  Randye LoboBrook E. Silva, M.D.        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  PROCEDURE:  Colposcopy with LEEP, ECC.  SURGEON:  Randye LoboBrook E. Silva, M.D.  ANESTHESIA:  LMA, local with 8 mL 1% lidocaine with epinephrine 1:200,000.  INTRAVENOUS FLUIDS:  1000 mL Ringer's lactate.  ESTIMATED BLOOD LOSS:  1 mL.  URINE OUTPUT:  The patient voided prior to coming to the operating room.  COMPLICATIONS:  None.  INDICATIONS FOR THE PROCEDURE:  The patient is a [age] year old gravida 0, Caucasian female, who presents with an ASCUS Pap and positive high-risk HPV.  The patient does have a history of DES exposure.  She had an unsatisfactory colposcopy on February 01, 2017.  The patient had significant difficulty tolerating the colposcopic exam as she suffers from anxiety.  A plan is now made to proceed with a colposcopy with LEEP.  Risks, benefits, and alternatives are reviewed with the patient, who wishes to proceed.  FINDINGS:  Colposcopic examination demonstrated cervical stenosis and significant atrophy of the cervix and the vagina.  There appeared to be some decreased Lugol's uptake over the cervix, but this was also partially attributed to atrophy and bleeding from the cervical mucosa. Colposcopy was unsatisfactory.  SPECIMENS:  The LEEP  specimen was sent to pathology marked with a suture at the 12 o'clock position.  Endocervical curettings were sent separately.  DESCRIPTION OF PROCEDURE:  The patient was re-identified in the preoperative hold area.  She received TED hose and PAS stockings for DVT prophylaxis.  In the operating room, the patient was placed in the dorsal lithotomy position.  An LMA anesthetic was administered.  The patient was sterilely draped.  Acetic acid 5% was placed inside the vagina after an insulated speculum was used to visualize the cervix.  Colposcopy was performed.  The findings are as noted above.  The speculum appeared to be pushing the cervix into the vaginal vault.  Lugol solution was placed on the cervix.  The cervix was then circumferentially injected with a total of 8 mL of 1% lidocaine with epinephrine 1:200,000.  The speculum was removed at this time and taken apart.  Each blade of the speculum was used to retract the anterior and posterior vaginal walls.  A single suture of 0 Vicryl was placed in the 12 o'clock position along the anterior cervix.  The LEEP was next performed with a setting of 60 watts of cutting.  This specimen was sent to pathology oriented at the 12 o'clock position with a suture.  An ECC was performed with a Kevorkian curette and sent to Pathology. There  was a significant amount of mucus, which appeared to be trapped behind the cervix.  The perimeter of the LEEP was cauterized with monopolar cautery.  Hemostasis was excellent.  Monsel's was then placed over the bed of the LEEP.  At this time, the patient was awakened and taken out of dorsal lithotomy position.  She was escorted to the recovery room in stable condition.  There were no complications to the procedure.  All needle, instrument, and sponge counts were correct.     Randye LoboBrook E. Silva, M.D.     BES/MEDQ  D:  05/07/2017  T:  05/07/2017  Job:  [id number]

## 2017-05-07 NOTE — Progress Notes (Signed)
Update to History and Physical  No marked change in status since office pre-op visit.   Patient examined.   OK to proceed with surgery. 

## 2017-05-07 NOTE — Anesthesia Procedure Notes (Signed)
Procedure Name: LMA Insertion Date/Time: 05/07/2017 8:49 AM Performed by: Leonides GrillsELLENDER, RYAN P Pre-anesthesia Checklist: Patient identified, Patient being monitored, Emergency Drugs available, Timeout performed and Suction available Patient Re-evaluated:Patient Re-evaluated prior to induction Oxygen Delivery Method: Circle System Utilized Preoxygenation: Pre-oxygenation with 100% oxygen Induction Type: IV induction Ventilation: Mask ventilation without difficulty LMA: LMA inserted LMA Size: 4.0 and 3.0 Number of attempts: 1 Placement Confirmation: positive ETCO2 and breath sounds checked- equal and bilateral

## 2017-05-07 NOTE — Brief Op Note (Signed)
05/07/2017  9:25 AM  PATIENT:  Becky FosterJulie Price  61 y.o. female  PRE-OPERATIVE DIAGNOSIS:  ASCUS/positive HPV, Unsatisfactory colpo, DES exposure  POST-OPERATIVE DIAGNOSIS:  ASCUS/positive HPV, Unsatisfactory colpo, DES exposure  PROCEDURE:  Procedure(s) with comments: LOOP ELECTROSURGICAL EXCISION PROCEDURE (LEEP) with colpo (N/A) - rep will be here confirmed 04/24/17.  ECC.  SURGEON:  Surgeon(s) and Role:    * Amundson Shirley Friar Silva, Mozel Burdett E, MD - Primary  PHYSICIAN ASSISTANT: NA  ASSISTANTS: none   ANESTHESIA:   local and LMA.  EBL:  Total I/O In: 1000 [I.V.:1000] Out: 1 [Blood:1]  BLOOD ADMINISTERED:none  DRAINS: none   LOCAL MEDICATIONS USED:  OTHER lidocaine 1% with epinephrine 1:200,000 - 8 cc.  SPECIMEN:  Source of Specimen:  LEEP specimen, ECC.  DISPOSITION OF SPECIMEN:  PATHOLOGY  COUNTS:  YES  TOURNIQUET:  * No tourniquets in log *  DICTATION: .Other Dictation: Dictation Number    PLAN OF CARE: Discharge to home after PACU  PATIENT DISPOSITION:  PACU - hemodynamically stable.   Delay start of Pharmacological VTE agent (>24hrs) due to surgical blood loss or risk of bleeding: not applicable

## 2017-05-07 NOTE — Anesthesia Preprocedure Evaluation (Addendum)
Anesthesia Evaluation  Patient identified by MRN, date of birth, ID band Patient awake    Reviewed: Allergy & Precautions, NPO status , Patient's Chart, lab work & pertinent test results  Airway Mallampati: I  TM Distance: >3 FB Neck ROM: Full    Dental no notable dental hx.    Pulmonary former smoker,    Pulmonary exam normal breath sounds clear to auscultation       Cardiovascular negative cardio ROS Normal cardiovascular exam Rhythm:Regular Rate:Normal     Neuro/Psych negative neurological ROS  negative psych ROS   GI/Hepatic negative GI ROS, Neg liver ROS,   Endo/Other  Hypothyroidism   Renal/GU negative Renal ROS     Musculoskeletal negative musculoskeletal ROS (+)   Abdominal   Peds  Hematology negative hematology ROS (+)   Anesthesia Other Findings   Reproductive/Obstetrics                            Anesthesia Physical Anesthesia Plan  ASA: II  Anesthesia Plan: General   Post-op Pain Management:    Induction: Intravenous  PONV Risk Score and Plan: 3 and Ondansetron, Dexamethasone, Midazolam and Propofol infusion  Airway Management Planned: LMA  Additional Equipment:   Intra-op Plan:   Post-operative Plan: Extubation in OR  Informed Consent: I have reviewed the patients History and Physical, chart, labs and discussed the procedure including the risks, benefits and alternatives for the proposed anesthesia with the patient or authorized representative who has indicated his/her understanding and acceptance.   Dental advisory given  Plan Discussed with: CRNA  Anesthesia Plan Comments:         Anesthesia Quick Evaluation

## 2017-05-07 NOTE — Discharge Instructions (Signed)
°Post Anesthesia Home Care Instructions ° °Activity: °Get plenty of rest for the remainder of the day. A responsible individual must stay with you for 24 hours following the procedure.  °For the next 24 hours, DO NOT: °-Drive a car °-Operate machinery °-Drink alcoholic beverages °-Take any medication unless instructed by your physician °-Make any legal decisions or sign important papers. ° °Meals: °Start with liquid foods such as gelatin or soup. Progress to regular foods as tolerated. Avoid greasy, spicy, heavy foods. If nausea and/or vomiting occur, drink only clear liquids until the nausea and/or vomiting subsides. Call your physician if vomiting continues. ° °Special Instructions/Symptoms: °Your throat may feel dry or sore from the anesthesia or the breathing tube placed in your throat during surgery. If this causes discomfort, gargle with warm salt water. The discomfort should disappear within 24 hours. ° °If you had a scopolamine patch placed behind your ear for the management of post- operative nausea and/or vomiting: ° °1. The medication in the patch is effective for 72 hours, after which it should be removed.  Wrap patch in a tissue and discard in the trash. Wash hands thoroughly with soap and water. °2. You may remove the patch earlier than 72 hours if you experience unpleasant side effects which may include dry mouth, dizziness or visual disturbances. °3. Avoid touching the patch. Wash your hands with soap and water after contact with the patch. °  °Cervical Conization, Care After °This sheet gives you information about how to care for yourself after your procedure. Your doctor may also give you more specific instructions. If you have problems or questions, contact your doctor. °Follow these instructions at home: °Medicines °· Take over-the-counter and prescription medicines only as told by your doctor. °· Do not take aspirin until your doctor says it is okay. °· If you take pain medicine: °? You may  have constipation. To help treat this, your doctor may tell you to: °§ Drink enough fluid to keep your pee (urine) clear or pale yellow. °§ Take medicines. °§ Eat foods that are high in fiber. These include fresh fruits and vegetables, whole grains, bran, and beans. °§ Limit foods that are high in fat and sugar. These include fried foods and sweet foods. °? Do not drive or use heavy machines. °General instructions °· You can eat your usual diet unless your doctor tells you not to do so. °· Take showers for the first week. Do not take baths, swim, or use hot tubs until your doctor says it is okay. °· Do not douche, use tampons, or have sex until your doctor says it is okay. °· For 7-14 days after your procedure, avoid: °? Being very active. °? Exercising. °? Heavy lifting. °· Keep all follow-up visits as told by your doctor. This is important. °Contact a doctor if: °· You have a rash. °· You are dizzy or lightheaded. °· You feel sick to your stomach (nauseous). °· You throw up (vomit). °· You have fluid from your vagina (vaginal discharge) that smells bad. °Get help right away if: °· There are blood clots coming from your vagina. °· You have more bleeding than you would have in a normal period. For example, you soak a pad in less than 1 hour. °· You have a fever. °· You have more and more cramps. °· You pass out (faint). °· You have pain when peeing. °· Your have a lot of pain. °· Your pain gets worse. °· Your pain does not get better when   you take your medicine. °· You have blood in your pee. °· You throw up (vomit). °Summary °· After your procedure, take over-the-counter and prescription medicines only as told by your doctor. °· Do not douche, use tampons, or have sex until your doctor says it is okay. °· For about 7-14 days after your procedure, try not to exercise or lift heavy objects. °· Get help right away if you have new symptoms, or if your symptoms become worse. °This information is not intended to replace  advice given to you by your health care provider. Make sure you discuss any questions you have with your health care provider. °Document Released: 07/04/2008 Document Revised: 09/27/2016 Document Reviewed: 09/27/2016 °Elsevier Interactive Patient Education © 2017 [employer] ° °

## 2017-05-07 NOTE — Anesthesia Postprocedure Evaluation (Signed)
Anesthesia Post Note  Patient: Becky Price  Procedure(s) Performed: Procedure(s) (LRB): LOOP ELECTROSURGICAL EXCISION PROCEDURE (LEEP) with colpo (N/A)     Patient location during evaluation: PACU Anesthesia Type: General Level of consciousness: awake and alert Pain management: pain level controlled Vital Signs Assessment: post-procedure vital signs reviewed and stable Respiratory status: spontaneous breathing, nonlabored ventilation, respiratory function stable and patient connected to nasal cannula oxygen Cardiovascular status: blood pressure returned to baseline and stable Postop Assessment: no signs of nausea or vomiting Anesthetic complications: no    Last Vitals:  Vitals:   05/07/17 0945 05/07/17 1000  BP: 123/62 127/70  Pulse: 72 69  Resp: 16 16  Temp:      Last Pain:  Vitals:   05/07/17 0754  TempSrc: Oral   Pain Goal: Patients Stated Pain Goal: 5 (05/07/17 0754)               Catheryn Baconyan P Sheba Whaling

## 2017-05-07 NOTE — Transfer of Care (Signed)
Immediate Anesthesia Transfer of Care Note  Patient: Becky Price  Procedure(s) Performed: Procedure(s) with comments: LOOP ELECTROSURGICAL EXCISION PROCEDURE (LEEP) with colpo (N/A) - rep will be here confirmed 04/24/17.  Patient Location: PACU  Anesthesia Type:General  Level of Consciousness: awake, alert  and oriented  Airway & Oxygen Therapy: Patient Spontanous Breathing and Patient connected to nasal cannula oxygen  Post-op Assessment: Report given to RN, Post -op Vital signs reviewed and stable and Patient moving all extremities X 4  Post vital signs: Reviewed and stable  Last Vitals:  Vitals:   05/07/17 0754  BP: 120/66  Pulse: 63  Resp: 16  Temp: 36.4 C    Last Pain:  Vitals:   05/07/17 0754  TempSrc: Oral      Patients Stated Pain Goal: 5 (05/07/17 0754)  Complications: No apparent anesthesia complications

## 2017-05-08 ENCOUNTER — Encounter (HOSPITAL_COMMUNITY): Payer: Self-pay | Admitting: Obstetrics and Gynecology

## 2017-05-10 ENCOUNTER — Telehealth: Payer: Self-pay | Admitting: Obstetrics and Gynecology

## 2017-05-10 NOTE — Telephone Encounter (Signed)
Called patient is attempt to reschedule her 1 week post op appointment with Dr. Edward JollySilva on 05/14/17 to later in the day that afternoon. Patient declined to reschedule for that day stating she can only come for a visit between 10:30-12:30. I explained the doctor will be in surgery at that time.   Routing to [place] for assistance with rescheduling.

## 2017-05-11 NOTE — Telephone Encounter (Signed)
Call to patient. Advised of instructions from Dr Edward JollySilva. Encounter closed.

## 2017-05-11 NOTE — Telephone Encounter (Signed)
Call to patient. States she is doing very well since procedure. Advised only needs the 4 week post op appointment scheduled for 05-25-17.  Patient would like to confirm when she can return to swimming in private pool. States vaginal discharge after procedure has stopped.  Advised would need to review with Dr Edward JollySilva.

## 2017-05-11 NOTE — Telephone Encounter (Signed)
I would wait for 2 weeks before submerging in a tub or pool.

## 2017-05-14 ENCOUNTER — Ambulatory Visit: Payer: Managed Care, Other (non HMO) | Admitting: Obstetrics and Gynecology

## 2017-05-21 ENCOUNTER — Encounter: Payer: Self-pay | Admitting: Obstetrics and Gynecology

## 2017-05-21 ENCOUNTER — Telehealth: Payer: Self-pay | Admitting: Obstetrics and Gynecology

## 2017-05-21 ENCOUNTER — Ambulatory Visit (INDEPENDENT_AMBULATORY_CARE_PROVIDER_SITE_OTHER): Payer: Managed Care, Other (non HMO) | Admitting: Obstetrics and Gynecology

## 2017-05-21 VITALS — BP 140/76 | HR 70 | Ht 61.0 in | Wt 111.0 lb

## 2017-05-21 DIAGNOSIS — N760 Acute vaginitis: Secondary | ICD-10-CM | POA: Diagnosis not present

## 2017-05-21 MED ORDER — METRONIDAZOLE 500 MG PO TABS: 500.0000 mg | ORAL_TABLET | Freq: Two times a day (BID) | ORAL | 0 refills | Status: DC

## 2017-05-21 NOTE — Progress Notes (Signed)
GYNECOLOGY  VISIT   HPI: 61 y.o.   Married  Caucasian  female   G0P0000 with Patient's last menstrual period was 08/09/2010 (approximate).   here for vaginal discharge with odor and itching status post 2 weeks LEEP procedure.   Final pathology showed no cancer and no dysplasia.  Some itching and odor.   No bleeding.   Dog is sick and she may have to put the dog down.   GYNECOLOGIC HISTORY: Patient's last menstrual period was 08/09/2010 (approximate). Contraception:  Postmenopausal Menopausal hormone therapy:  none Last mammogram: 01-17-17 Bil.Diag.with Implants & Rt.Br.US--Density C/Neg/BiRads1/screening 72yr:TBC Last pap smear:  01-15-17 Ascus:Pos HR HPV--colpo revealing atrophic changes;07-31-14 Neg:Pos HR HPV        OB History    Gravida Para Term Preterm AB Living   0 0 0 0 0 0   SAB TAB Ectopic Multiple Live Births   0 0 0 0 0         Patient Active Problem List   Diagnosis Date Noted  . Abdominal pain, epigastric 01/13/2013    Past Medical History:  Diagnosis Date  . DES exposure in utero   . History of palpitations    red wine triggers palpitations along with stress/anxiety  . HPV in female   . Hypothyroidism   . Postmenopausal state 2012   No HRT  . Thyroid disease 1989   Dr. Juleen China    Past Surgical History:  Procedure Laterality Date  . BREAST ENHANCEMENT SURGERY  2011  . BREAST SURGERY    . COLONOSCOPY    . LEEP N/A 05/07/2017   Procedure: LOOP ELECTROSURGICAL EXCISION PROCEDURE (LEEP) with colpo;  Surgeon: Patton Salles, MD;  Location: [facility];  Service: Gynecology;  Laterality: N/A;  rep will be here confirmed 04/24/17.  . WISDOM TOOTH EXTRACTION      Current Outpatient Prescriptions  Medication Sig Dispense Refill  . carboxymethylcellulose (REFRESH PLUS) 0.5 % SOLN Apply 2 drops to eye every morning.    Marland Kitchen levothyroxine (SYNTHROID, LEVOTHROID) 125 MCG tablet Take 125 mcg by mouth daily before breakfast.    . Multiple Vitamin (MULTIVITAMIN  WITH MINERALS) TABS tablet Take 1 tablet by mouth daily.    Marland Kitchen ibuprofen (ADVIL,MOTRIN) 800 MG tablet Take 1 tablet (800 mg total) by mouth every 8 (eight) hours as needed. (Patient not taking: Reported on 05/21/2017) 30 tablet 0   No current facility-administered medications for this visit.      ALLERGIES: Patient has no known allergies.  Family History  Problem Relation Age of Onset  . Pancreatic cancer Father 43  . Alcoholism Father   . Alcoholism Sister   . Diabetes Brother   . Heart failure Maternal Grandfather        elderly    Social History   Social History  . Marital status: Married    Spouse name: N/A  . Number of children: N/A  . Years of education: N/A   Occupational History  . Not on file.   Social History Main Topics  . Smoking status: Former Smoker    Packs/day: 0.50    Years: 10.00    Types: Cigarettes    Quit date: 08/09/1992  . Smokeless tobacco: Never Used  . Alcohol use 4.2 oz/week    7 Glasses of wine per week     Comment: about 1 glasses of wine per night  . Drug use: No  . Sexual activity: Yes    Partners: Male    Birth control/  protection: Post-menopausal   Other Topics Concern  . Not on file   Social History Narrative  . No narrative on file    ROS:  Pertinent items are noted in HPI.  PHYSICAL EXAMINATION:    BP 140/76 (BP Location: Right Arm, Patient Position: Sitting, Cuff Size: Small)   Pulse 70   Ht 5\' 1"  (1.549 m)   Wt 111 lb (50.3 kg)   LMP 08/09/2010 (Approximate)   BMI 20.97 kg/m     General appearance: alert, cooperative and appears stated age   Pelvic: External genitalia:  no lesions              Urethra:  normal appearing urethra with no masses, tenderness or lesions              Bartholins and Skenes: normal                 Vagina: normal appearing vagina with normal color and discharge, no lesions              Cervix:  Consistent with LEEP.  Suture partially dissolved.  Large amount of yellow drainage with very  light bleeding.                 Bimanual Exam:  Uterus:  normal size, contour, position, consistency, mobility, non-tender              Adnexa: no mass, fullness, tenderness               Chaperone was present for exam.  ASSESSMENT  Vaginitis.  Status post LEEP.  Final pathology negative.    PLAN  Affirm.  Flagyl 500 mg po bid x 7 days.  ETOH precautions.  Nothing per vagina at this time. Follow up for post op check in 2 weeks.    An After Visit Summary was printed and given to the patient.  __15____ minutes face to face time of which over 50% was spent in counseling.

## 2017-05-21 NOTE — Patient Instructions (Signed)
I will see you back in about 2 weeks for your post op visit!

## 2017-05-21 NOTE — Telephone Encounter (Signed)
Spoke with patient, states she had a biopsy 2 weeks ago and is concerned about infection. Patient reports intermittent vaginal d/c clear to cloudy with odor and vaginal itching. Denies bleeding, urinary symptoms, pain, N/V, fever. LEEP with colpo on 05/07/17.  Recommended OV for further evaluation. Patient declined 3:30pm appointment d/t another appointment. Patient scheduled for 8/13 at 1:15pm with Dr.Silva. Patient is agreeable to date and time.  Routing to provider for final review. Patient is agreeable to disposition. Will close encounter.

## 2017-05-21 NOTE — Telephone Encounter (Signed)
Patient had biopsy done a couple weeks ago and is feeling itchy and noticing an odor.

## 2017-05-22 LAB — VAGINITIS/VAGINOSIS, DNA PROBE
Candida Species: NEGATIVE
Gardnerella vaginalis: NEGATIVE
TRICHOMONAS VAG: NEGATIVE

## 2017-06-04 ENCOUNTER — Encounter: Payer: Self-pay | Admitting: Obstetrics and Gynecology

## 2017-06-04 ENCOUNTER — Ambulatory Visit (INDEPENDENT_AMBULATORY_CARE_PROVIDER_SITE_OTHER): Payer: Managed Care, Other (non HMO) | Admitting: Obstetrics and Gynecology

## 2017-06-04 VITALS — BP 100/64 | HR 64 | Ht 61.0 in | Wt 111.0 lb

## 2017-06-04 DIAGNOSIS — Z9889 Other specified postprocedural states: Secondary | ICD-10-CM

## 2017-06-04 NOTE — Progress Notes (Signed)
GYNECOLOGY  VISIT   HPI: 61 y.o.   Married  Caucasian  female   G0P0000 with Patient's last menstrual period was 08/09/2010 (approximate).   here for 4 week follow up LOOP ELECTROSURGICAL EXCISION PROCEDURE (LEEP) with colpo (N/A Vagina ).  Took only a couple of days of Flagyl.  Her Affirm was negative.   Larey Seat and hit her face on a brink staircase when she returned home after putting her dog to sleep one week ago. Husband was present with her.  She lost consciousness.  Now has bruising of face and left hand.  No visual change.  GYNECOLOGIC HISTORY: Patient's last menstrual period was 08/09/2010 (approximate). Contraception:  Postmenopausal Menopausal hormone therapy:  none Last mammogram: 01-17-17 Bil.Diag.with Implants & Rt.Br.US--Density C/Neg/BiRads1/screening 55yr:TBC  Last pap smear: 01-15-17 Ascus:Pos HR HPV--colpo revealing atrophic changes;07-31-14 Neg:Pos HR HPV        OB History    Gravida Para Term Preterm AB Living   0 0 0 0 0 0   SAB TAB Ectopic Multiple Live Births   0 0 0 0 0         Patient Active Problem List   Diagnosis Date Noted  . Abdominal pain, epigastric 01/13/2013    Past Medical History:  Diagnosis Date  . DES exposure in utero   . History of palpitations    red wine triggers palpitations along with stress/anxiety  . HPV in female   . Hypothyroidism   . Postmenopausal state 2012   No HRT  . Thyroid disease 1989   Dr. Juleen China    Past Surgical History:  Procedure Laterality Date  . BREAST ENHANCEMENT SURGERY  2011  . BREAST SURGERY    . COLONOSCOPY    . LEEP N/A 05/07/2017   Procedure: LOOP ELECTROSURGICAL EXCISION PROCEDURE (LEEP) with colpo;  Surgeon: Patton Salles, MD;  Location: [facility];  Service: Gynecology;  Laterality: N/A;  rep will be here confirmed 04/24/17.  . WISDOM TOOTH EXTRACTION      Current Outpatient Prescriptions  Medication Sig Dispense Refill  . carboxymethylcellulose (REFRESH PLUS) 0.5 % SOLN Apply 2  drops to eye every morning.    Marland Kitchen ibuprofen (ADVIL,MOTRIN) 800 MG tablet Take 1 tablet (800 mg total) by mouth every 8 (eight) hours as needed. 30 tablet 0  . metroNIDAZOLE (FLAGYL) 500 MG tablet Take 1 tablet (500 mg total) by mouth 2 (two) times daily. 14 tablet 0  . Multiple Vitamin (MULTIVITAMIN WITH MINERALS) TABS tablet Take 1 tablet by mouth daily.    Marland Kitchen SYNTHROID 112 MCG tablet Take 1 tablet by mouth daily.     No current facility-administered medications for this visit.      ALLERGIES: Patient has no known allergies.  Family History  Problem Relation Age of Onset  . Pancreatic cancer Father 90  . Alcoholism Father   . Alcoholism Sister   . Diabetes Brother   . Heart failure Maternal Grandfather        elderly    Social History   Social History  . Marital status: Married    Spouse name: N/A  . Number of children: N/A  . Years of education: N/A   Occupational History  . Not on file.   Social History Main Topics  . Smoking status: Former Smoker    Packs/day: 0.50    Years: 10.00    Types: Cigarettes    Quit date: 08/09/1992  . Smokeless tobacco: Never Used  . Alcohol use 4.2 oz/week  7 Glasses of wine per week     Comment: about 1 glasses of wine per night  . Drug use: No  . Sexual activity: Yes    Partners: Male    Birth control/ protection: Post-menopausal   Other Topics Concern  . Not on file   Social History Narrative  . No narrative on file    ROS:  Pertinent items are noted in HPI.  PHYSICAL EXAMINATION:    BP 100/64 (BP Location: Right Arm, Patient Position: Sitting, Cuff Size: Normal)   Pulse 64   Ht 5\' 1"  (1.549 m)   Wt 111 lb (50.3 kg)   LMP 08/09/2010 (Approximate)   BMI 20.97 kg/m     General appearance: alert, cooperative and appears stated age Head:  Extensive bruising around right eye.  No induration around orbit.   Extremities:  Left hand bruising.    Pelvic: External genitalia:  no lesions              Urethra:  normal  appearing urethra with no masses, tenderness or lesions              Bartholins and Skenes: normal                 Vagina: normal appearing vagina with normal color and discharge, no lesions              Cervix: no lesions.  Raw LEEP bed noted.                Bimanual Exam:  Uterus:  normal size, contour, position, consistency, mobility, non-tender              Adnexa: no mass, fullness, tenderness             Chaperone was present for exam.  ASSESSMENT  Status post LEEP.  Status post facial and left hand trauma.  PLAN  Nothing per vagina until after next visit in 3 weeks.  May need estrogen cream tx.  Needs co-testing in July 2019.   An After Visit Summary was printed and given to the patient.

## 2017-06-27 ENCOUNTER — Ambulatory Visit (INDEPENDENT_AMBULATORY_CARE_PROVIDER_SITE_OTHER): Payer: Managed Care, Other (non HMO) | Admitting: Obstetrics and Gynecology

## 2017-06-27 ENCOUNTER — Encounter: Payer: Self-pay | Admitting: Obstetrics and Gynecology

## 2017-06-27 VITALS — BP 108/60 | HR 60 | Ht 61.0 in | Wt 112.0 lb

## 2017-06-27 DIAGNOSIS — N952 Postmenopausal atrophic vaginitis: Secondary | ICD-10-CM

## 2017-06-27 DIAGNOSIS — Z9889 Other specified postprocedural states: Secondary | ICD-10-CM | POA: Diagnosis not present

## 2017-06-27 MED ORDER — ESTROGENS, CONJUGATED 0.625 MG/GM VA CREA: TOPICAL_CREAM | VAGINAL | 2 refills | Status: DC

## 2017-06-27 NOTE — Progress Notes (Signed)
GYNECOLOGY  VISIT   HPI: 61 y.o.   Married  Caucasian  female   G0P0000 with Patient's last menstrual period was 08/09/2010 (approximate).   here for 7 week follow up LOOP ELECTROSURGICAL EXCISION PROCEDURE (LEEP) with colpo (N/A Vagina ).   No bleeding or pain.  No vaginal drainage.   GYNECOLOGIC HISTORY: Patient's last menstrual period was 08/09/2010 (approximate). Contraception:  Postmenopausal Menopausal hormone therapy:  none Last mammogram: 01-17-17 Bil.Diag.with Implants & Rt.Br.US--Density C/Neg/BiRads1/screening 24yr:TBC  Last pap smear: 01-15-17 Ascus:Pos HR HPV--colpo revealing atrophic changes;07-31-14 Neg:Pos HR HPV        OB History    Gravida Para Term Preterm AB Living   0 0 0 0 0 0   SAB TAB Ectopic Multiple Live Births   0 0 0 0 0         Patient Active Problem List   Diagnosis Date Noted  . Abdominal pain, epigastric 01/13/2013    Past Medical History:  Diagnosis Date  . DES exposure in utero   . History of palpitations    red wine triggers palpitations along with stress/anxiety  . HPV in female   . Hypothyroidism   . Postmenopausal state 2012   No HRT  . Thyroid disease 1989   Dr. Juleen China    Past Surgical History:  Procedure Laterality Date  . BREAST ENHANCEMENT SURGERY  2011  . BREAST SURGERY    . COLONOSCOPY    . LEEP N/A 05/07/2017   Procedure: LOOP ELECTROSURGICAL EXCISION PROCEDURE (LEEP) with colpo;  Surgeon: Patton Salles, MD;  Location: [facility];  Service: Gynecology;  Laterality: N/A;  rep will be here confirmed 04/24/17.  . WISDOM TOOTH EXTRACTION      Current Outpatient Prescriptions  Medication Sig Dispense Refill  . carboxymethylcellulose (REFRESH PLUS) 0.5 % SOLN Apply 2 drops to eye every morning.    . Multiple Vitamin (MULTIVITAMIN WITH MINERALS) TABS tablet Take 1 tablet by mouth daily.    Marland Kitchen SYNTHROID 112 MCG tablet Take 1 tablet by mouth daily.     No current facility-administered medications for this visit.       ALLERGIES: Patient has no known allergies.  Family History  Problem Relation Age of Onset  . Pancreatic cancer Father 70  . Alcoholism Father   . Alcoholism Sister   . Diabetes Brother   . Heart failure Maternal Grandfather        elderly    Social History   Social History  . Marital status: Married    Spouse name: N/A  . Number of children: N/A  . Years of education: N/A   Occupational History  . Not on file.   Social History Main Topics  . Smoking status: Former Smoker    Packs/day: 0.50    Years: 10.00    Types: Cigarettes    Quit date: 08/09/1992  . Smokeless tobacco: Never Used  . Alcohol use 4.2 oz/week    7 Glasses of wine per week     Comment: about 1 glasses of wine per night  . Drug use: No  . Sexual activity: Yes    Partners: Male    Birth control/ protection: Post-menopausal   Other Topics Concern  . Not on file   Social History Narrative  . No narrative on file    ROS:  Pertinent items are noted in HPI.  PHYSICAL EXAMINATION:    BP 108/60 (BP Location: Right Arm, Patient Position: Sitting, Cuff Size: Normal)   Pulse  60   Ht  (1.549 m)   Wt 112 lb (50.8 kg)   LMP 08/09/2010 (Approximate)   BMI 21.16 kg/m     General appearance: alert, cooperative and appears stated age    Pelvic: External genitalia:  no lesions              Urethra:  normal appearing urethra with no masses, tenderness or lesions              Bartholins and Skenes: normal                 Vagina: normal appearing vagina with normal color and discharge, no lesions              Cervix:  Atrophy of cervix and vagina.  Ecchymoses of both.  Less raw surface of cervix today.                 Bimanual Exam:  Uterus:  normal size, contour, position, consistency, mobility, non-tender              Adnexa: no mass, fullness, tenderness               Chaperone was present for exam.  ASSESSMENT  Status post LEEP with negative final pathology.  Vaginal and cervical  atrophy.  PLAN  Start Premarin vag cream 1/2 gm x 2 weeks and then 1/2 gm twice weekly.  Coupon given.  Discussed increased risk of breast CA. Follow up in 4 weeks.    An After Visit Summary was printed and given to the patient.  ___15___ minutes face to face time of which over 50% was spent in counseling.

## 2017-07-23 ENCOUNTER — Telehealth: Payer: Self-pay | Admitting: Obstetrics and Gynecology

## 2017-07-23 NOTE — Telephone Encounter (Signed)
Patient called and cancelled her appointment with Dr. Edward Jolly for a 4 week recheck on 07/25/17. She said she does not need to reschedule this appointment and she plans on just coming annually.

## 2017-07-25 ENCOUNTER — Ambulatory Visit: Payer: Managed Care, Other (non HMO) | Admitting: Obstetrics and Gynecology

## 2017-07-25 NOTE — Telephone Encounter (Signed)
I do recommend she return for her recheck appointment.  She has significant vaginal/cervical atrophy and is healing slowly following her LEEP procedure.  I prescribed vaginal estrogen cream and do recommend she return for a recheck.   Cc -  Starla Curl

## 2017-08-08 NOTE — Telephone Encounter (Signed)
Follow-up call to patient. Per ROI can leave message on voice mail and message confirms "Becky Price."   Left message explaining importance of follow-up office visit. Requested  Call back and will assist in scheduling appointment at her convenience.

## 2017-08-15 NOTE — Telephone Encounter (Signed)
Call to patient. Per DPR can leave message on cell number and voice mail confirms "Becky Price." Left message calling to assist with rescheduling follow-up appointment. Dr Edward JollySilva recommends recheck to make sure medication is working as intended to assist healing. Left message to call back.

## 2017-08-17 NOTE — Telephone Encounter (Signed)
No patient response after two calls with detailed messages.  Please advise if additional follow up or close encounter?  Patient in 08 recall for 03-23-18.

## 2017-08-18 NOTE — Telephone Encounter (Signed)
I reviewed the encounter and closed it.  She is in 08 recall.

## 2017-11-17 DIAGNOSIS — S72009A Fracture of unspecified part of neck of unspecified femur, initial encounter for closed fracture: Secondary | ICD-10-CM | POA: Insufficient documentation

## 2017-11-27 HISTORY — PX: LEG SURGERY: SHX1003

## 2018-05-20 ENCOUNTER — Telehealth: Payer: Self-pay

## 2018-05-20 NOTE — Telephone Encounter (Signed)
Patient did not respond to previous attempts to reach her back in October 2018.  Please advise on further follow-up.

## 2018-05-20 NOTE — Telephone Encounter (Signed)
Call made to patient to schedule annual exam. Last AEX 01/15/17 with Becky CommentPatricia Grubb, FNP ,Colposcopy 02/01/17 with Dr. Edward JollySilva, and a LEEP procedure 05/07/17 with Dr. Edward JollySilva. Patient declined to schedule an appointment and stated that she would call back. Tried explaining the importance of having her AEX, patient disregarded the call and hung up before further instructions could be given.  Patient has a hx of abnormal pap smear and DES exposure.  Routing to Armen PickupSarah S. Yeakley, RN for review.

## 2018-05-21 ENCOUNTER — Encounter: Payer: Self-pay | Admitting: *Deleted

## 2018-05-21 NOTE — Telephone Encounter (Signed)
I recommend a letter sent to her recommending her well woman visit and follow up of her cervical atypia and high risk HPV.  BY ASCCP guidelines, she is due for a pap and HR HPV testing.

## 2018-05-21 NOTE — Telephone Encounter (Signed)
Letter to your desk for review.  

## 2018-05-23 NOTE — Telephone Encounter (Signed)
Letter signed and returned to you.  

## 2018-05-23 NOTE — Telephone Encounter (Signed)
Letter mailed. Recalls completed. Encounter closed.

## 2018-06-12 ENCOUNTER — Other Ambulatory Visit: Payer: Self-pay | Admitting: Obstetrics and Gynecology

## 2018-06-12 DIAGNOSIS — Z1231 Encounter for screening mammogram for malignant neoplasm of breast: Secondary | ICD-10-CM

## 2018-07-08 ENCOUNTER — Ambulatory Visit (INDEPENDENT_AMBULATORY_CARE_PROVIDER_SITE_OTHER): Payer: Managed Care, Other (non HMO) | Admitting: Obstetrics and Gynecology

## 2018-07-08 ENCOUNTER — Encounter: Payer: Self-pay | Admitting: Obstetrics and Gynecology

## 2018-07-08 ENCOUNTER — Other Ambulatory Visit (HOSPITAL_COMMUNITY)
Admission: RE | Admit: 2018-07-08 | Discharge: 2018-07-08 | Disposition: A | Discharge status: Home / Self Care | Payer: Managed Care, Other (non HMO) | Source: Ambulatory Visit | Attending: Obstetrics and Gynecology | Admitting: Obstetrics and Gynecology

## 2018-07-08 VITALS — BP 138/68 | HR 69 | Ht 60.75 in | Wt 113.0 lb

## 2018-07-08 DIAGNOSIS — Z1151 Encounter for screening for human papillomavirus (HPV): Secondary | ICD-10-CM | POA: Diagnosis not present

## 2018-07-08 DIAGNOSIS — Z01419 Encounter for gynecological examination (general) (routine) without abnormal findings: Secondary | ICD-10-CM | POA: Insufficient documentation

## 2018-07-08 DIAGNOSIS — Z113 Encounter for screening for infections with a predominantly sexual mode of transmission: Secondary | ICD-10-CM | POA: Insufficient documentation

## 2018-07-08 NOTE — Progress Notes (Signed)
62 y.o. G48P0000 Married female here for annual exam.    Patient desires STD testing.  Wants to feel safe that she does not have an infectious disease. She has discussed this with her therapist.  Had right leg surgery for a fracture due to a fall.   PCP:  none   Patient's last menstrual period was 08/09/2010 (approximate).     Period Cycle (Days): (postmenopausal)     Sexually active: Yes.    The current method of family planning is post menopausal status.    Exercising: Yes.    yoga, hiking, weights Smoker:  no  Health Maintenance: Pap:  01/15/2017 - atypia, HR HPV positive.  History of abnormal Pap:  Yes positive HPV.  Hx LEEP 05/07/17 - LEEP benign and ECC benign.  MMG:  01/17/2017 BI-RADS CATEGORY  1: Negative.  aHas appointment this week.  Colonoscopy:  n/a BMD:   09/27/14 - benign polyp, repeat in 10 years.  TDaP:  2018 per patient Gardasil:   never HIV: 2009 per patient negative Hep C: 01/15/2017 negative Screening Labs:   Today.   reports that she quit smoking about 25 years ago. Her smoking use included cigarettes. She has a 5.00 pack-year smoking history. She has never used smokeless tobacco. She reports that she drinks about 7.0 standard drinks of alcohol per week. She reports that she does not use drugs.  Past Medical History:  Diagnosis Date  . DES exposure in utero   . History of palpitations    red wine triggers palpitations along with stress/anxiety  . HPV in female   . Hypothyroidism   . Postmenopausal state 2012   No HRT  . Thyroid disease 1989   Dr. Juleen China    Past Surgical History:  Procedure Laterality Date  . BREAST ENHANCEMENT SURGERY  2011  . BREAST SURGERY    . COLONOSCOPY    . LEEP N/A 05/07/2017   Procedure: LOOP ELECTROSURGICAL EXCISION PROCEDURE (LEEP) with colpo;  Surgeon: Patton Salles, MD;  Location: [facility];  Service: Gynecology;  Laterality: N/A;  rep will be here confirmed 04/24/17.  Marland Kitchen LEG SURGERY Right 11/27/2017  . WISDOM  TOOTH EXTRACTION      Current Outpatient Medications  Medication Sig Dispense Refill  . carboxymethylcellulose (REFRESH PLUS) 0.5 % SOLN Apply 2 drops to eye every morning.    . Multiple Vitamin (MULTIVITAMIN WITH MINERALS) TABS tablet Take 1 tablet by mouth daily.    Marland Kitchen SYNTHROID 112 MCG tablet Take 1 tablet by mouth daily.     No current facility-administered medications for this visit.     Family History  Problem Relation Age of Onset  . Pancreatic cancer Father 28  . Alcoholism Father   . Alcoholism Sister   . Diabetes Brother   . Heart failure Maternal Grandfather        elderly    Review of Systems  Constitutional: Negative.   HENT: Negative.   Eyes: Negative.   Respiratory: Negative.   Cardiovascular: Negative.   Gastrointestinal: Negative.   Endocrine: Negative.   Genitourinary: Negative.   Musculoskeletal: Negative.   Skin: Negative.   Allergic/Immunologic: Negative.   Neurological: Negative.   Hematological: Negative.   Psychiatric/Behavioral: Negative.   All other systems reviewed and are negative.   Exam:   BP 138/68   Pulse 69   Ht 5' 0.75" (1.543 m)   Wt 113 lb (51.3 kg)   LMP 08/09/2010 (Approximate)   BMI 21.53 kg/m  General appearance: alert, cooperative and appears stated age Head: Normocephalic, without obvious abnormality, atraumatic Neck: no adenopathy, supple, symmetrical, trachea midline and thyroid normal to inspection and palpation Lungs: clear to auscultation bilaterally Breasts: bilateral implants, normal appearance, no masses or tenderness, No nipple retraction or dimpling, No nipple discharge or bleeding, No axillary or supraclavicular adenopathy Heart: regular rate and rhythm Abdomen: soft, non-tender; no masses, no organomegaly Extremities: extremities normal, atraumatic, no cyanosis or edema Skin: Skin color, texture, turgor normal. No rashes or lesions Lymph nodes: Cervical, supraclavicular, and axillary nodes normal. No  abnormal inguinal nodes palpated Neurologic: Grossly normal  Pelvic: External genitalia:  no lesions              Urethra:  normal appearing urethra with no masses, tenderness or lesions              Bartholins and Skenes: normal                 Vagina: normal appearing vagina with normal color and discharge, no lesions              Cervix: no lesions              Pap taken: Yes.   Bimanual Exam:  Uterus:  normal size, contour, position, consistency, mobility, non-tender              Adnexa: no mass, fullness, tenderness              Rectal exam: Yes.  .  Confirms.              Anus:  normal sphincter tone, no lesions  Chaperone was present for exam.  Assessment:   Well woman visit with normal exam. Hx LEEP.   Final results negative.  Previous atypia and positive HR HPV Vaginal atrophy.  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  Bilateral breast augmentation.  STD screening.  Hypothyroidism.  Followed by endocrinology.  Plan: Mammogram screening. Recommended self breast awareness. Pap and HR HPV as above. Guidelines for Calcium, Vitamin D, regular exercise program including cardiovascular and weight bearing exercise. STD screening.  Routine labs.  Declines refill of vaginal estrogen cream. Follow up annually and prn.   After visit summary provided.

## 2018-07-08 NOTE — Patient Instructions (Signed)

## 2018-07-09 LAB — COMPREHENSIVE METABOLIC PANEL
ALK PHOS: 68 IU/L (ref 39–117)
ALT: 16 IU/L (ref 0–32)
AST: 23 IU/L (ref 0–40)
Albumin/Globulin Ratio: 2.3 — ABNORMAL HIGH (ref 1.2–2.2)
Albumin: 4.8 g/dL (ref 3.5–5.5)
BUN / CREAT RATIO: 21 (ref 9–23)
BUN: 13 mg/dL (ref 6–24)
Bilirubin Total: 0.4 mg/dL (ref 0.0–1.2)
CALCIUM: 9.8 mg/dL (ref 8.7–10.2)
CO2: 23 mmol/L (ref 20–29)
CREATININE: 0.62 mg/dL (ref 0.57–1.00)
Chloride: 102 mmol/L (ref 96–106)
GFR calc Af Amer: 114 mL/min/{1.73_m2} (ref >59)
GFR, EST NON AFRICAN AMERICAN: 99 mL/min/{1.73_m2} (ref >59)
GLUCOSE: 84 mg/dL (ref 65–99)
Globulin, Total: 2.1 g/dL (ref 1.5–4.5)
Potassium: 4.3 mmol/L (ref 3.5–5.2)
SODIUM: 143 mmol/L (ref 134–144)
Total Protein: 6.9 g/dL (ref 6.0–8.5)

## 2018-07-09 LAB — LIPID PANEL
CHOL/HDL RATIO: 3 ratio (ref 0.0–4.4)
Cholesterol, Total: 232 mg/dL — ABNORMAL HIGH (ref 100–199)
HDL: 78 mg/dL (ref >39)
LDL CALC: 109 mg/dL — AB (ref 0–99)
TRIGLYCERIDES: 224 mg/dL — AB (ref 0–149)
VLDL Cholesterol Cal: 45 mg/dL — ABNORMAL HIGH (ref 5–40)

## 2018-07-09 LAB — CBC
Hematocrit: 40.1 % (ref 34.0–46.6)
Hemoglobin: 13.2 g/dL (ref 11.1–15.9)
MCH: 31.4 pg (ref 26.6–33.0)
MCHC: 32.9 g/dL (ref 31.5–35.7)
MCV: 96 fL (ref 79–97)
Platelets: 231 10*3/uL (ref 150–450)
RBC: 4.2 x10E6/uL (ref 3.77–5.28)
RDW: 13.5 % (ref 12.3–15.4)
WBC: 5.9 10*3/uL (ref 3.4–10.8)

## 2018-07-09 LAB — HEP, RPR, HIV PANEL
HEP B S AG: NEGATIVE
HIV Screen 4th Generation wRfx: NONREACTIVE
RPR Ser Ql: NONREACTIVE

## 2018-07-10 ENCOUNTER — Ambulatory Visit
Admission: RE | Admit: 2018-07-10 | Discharge: 2018-07-10 | Disposition: A | Discharge status: Home / Self Care | Payer: Managed Care, Other (non HMO) | Source: Ambulatory Visit | Attending: Obstetrics and Gynecology | Admitting: Obstetrics and Gynecology

## 2018-07-10 DIAGNOSIS — Z1231 Encounter for screening mammogram for malignant neoplasm of breast: Secondary | ICD-10-CM

## 2018-07-10 LAB — CYTOLOGY - PAP
Chlamydia: NEGATIVE
DIAGNOSIS: NEGATIVE
HPV (WINDOPATH): NOT DETECTED
Neisseria Gonorrhea: NEGATIVE
TRICH (WINDOWPATH): NEGATIVE

## 2018-07-11 ENCOUNTER — Other Ambulatory Visit: Payer: Self-pay | Admitting: Obstetrics and Gynecology

## 2018-07-11 ENCOUNTER — Encounter: Payer: Self-pay | Admitting: Obstetrics and Gynecology

## 2018-07-11 DIAGNOSIS — R928 Other abnormal and inconclusive findings on diagnostic imaging of breast: Secondary | ICD-10-CM

## 2018-07-12 LAB — HEPATITIS C ANTIBODY: Hep C Virus Ab: <0.1 s/co ratio (ref 0.0–0.9)

## 2018-07-12 LAB — SPECIMEN STATUS REPORT

## 2018-07-15 ENCOUNTER — Other Ambulatory Visit: Payer: Self-pay | Admitting: Obstetrics and Gynecology

## 2018-07-15 ENCOUNTER — Ambulatory Visit
Admission: RE | Admit: 2018-07-15 | Discharge: 2018-07-15 | Disposition: A | Discharge status: Home / Self Care | Payer: Managed Care, Other (non HMO) | Source: Ambulatory Visit | Attending: Obstetrics and Gynecology | Admitting: Obstetrics and Gynecology

## 2018-07-15 DIAGNOSIS — R928 Other abnormal and inconclusive findings on diagnostic imaging of breast: Secondary | ICD-10-CM

## 2019-07-16 ENCOUNTER — Ambulatory Visit: Payer: Managed Care, Other (non HMO) | Admitting: Obstetrics and Gynecology

## 2019-12-23 IMAGING — MG DIGITAL SCREENING BILATERAL MAMMOGRAM WITH IMPLANTS, CAD AND TOM
9 of 12 series · 9 of 28 positions shown · non-contrast
Comparison: Previous exam(s).

CLINICAL DATA: Screening.

EXAM:
DIGITAL SCREENING BILATERAL MAMMOGRAM WITH IMPLANTS, CAD AND TOMO
The patient has implants. Standard and implant displaced views were
performed.

[R MLO]
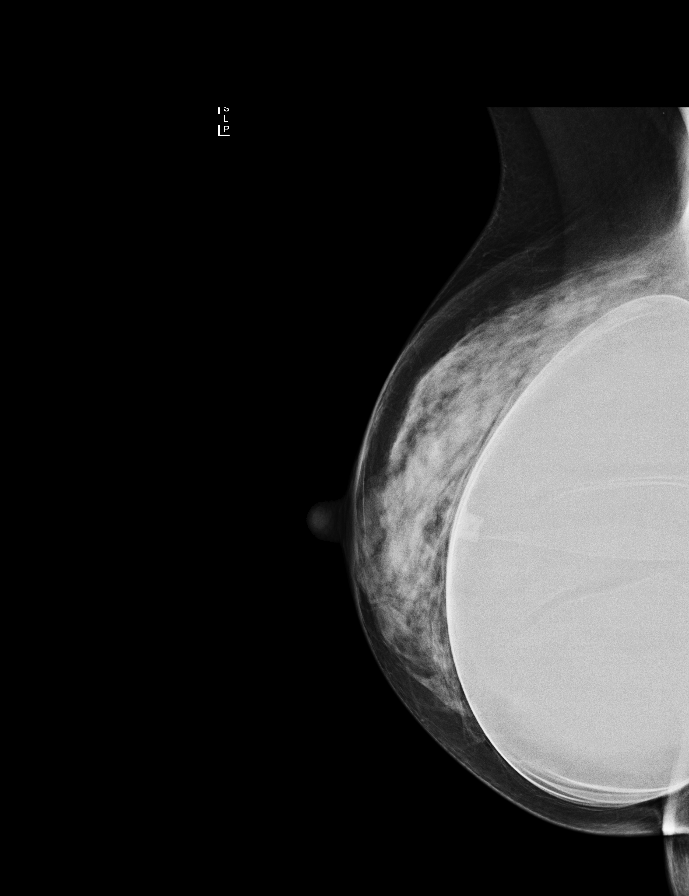

[L CC]
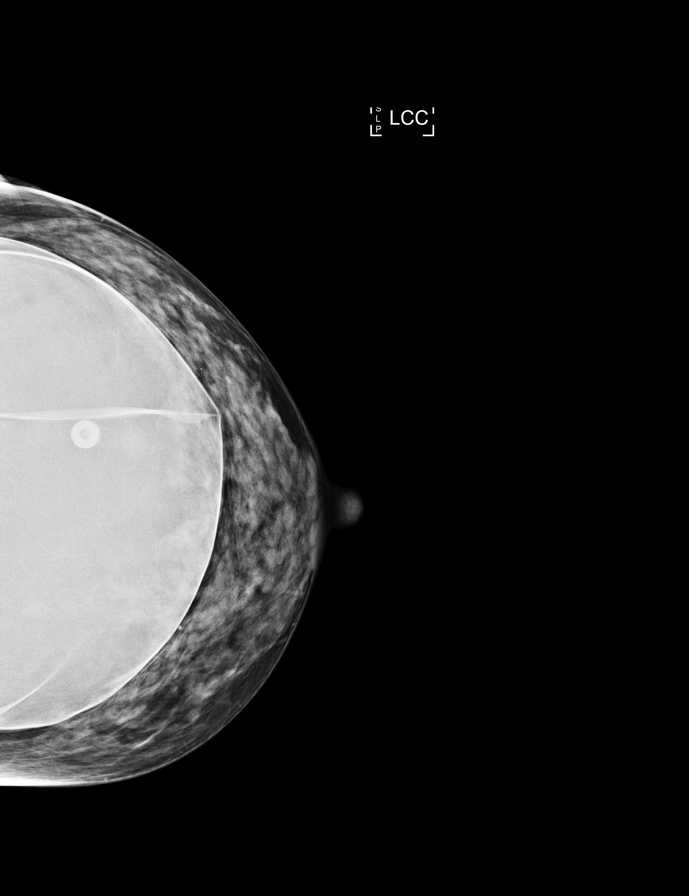

[R CC]
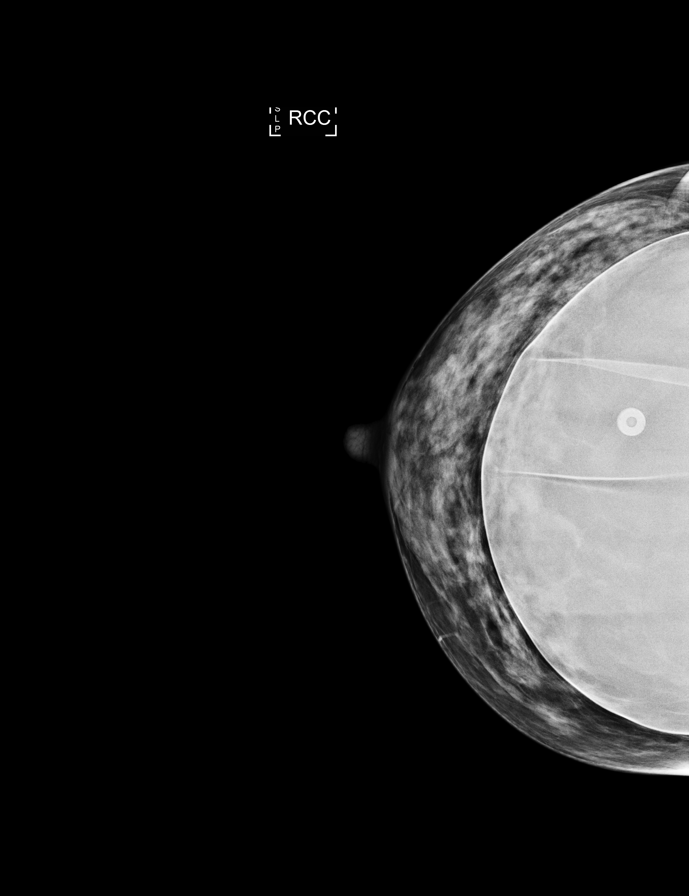

[L MLO]
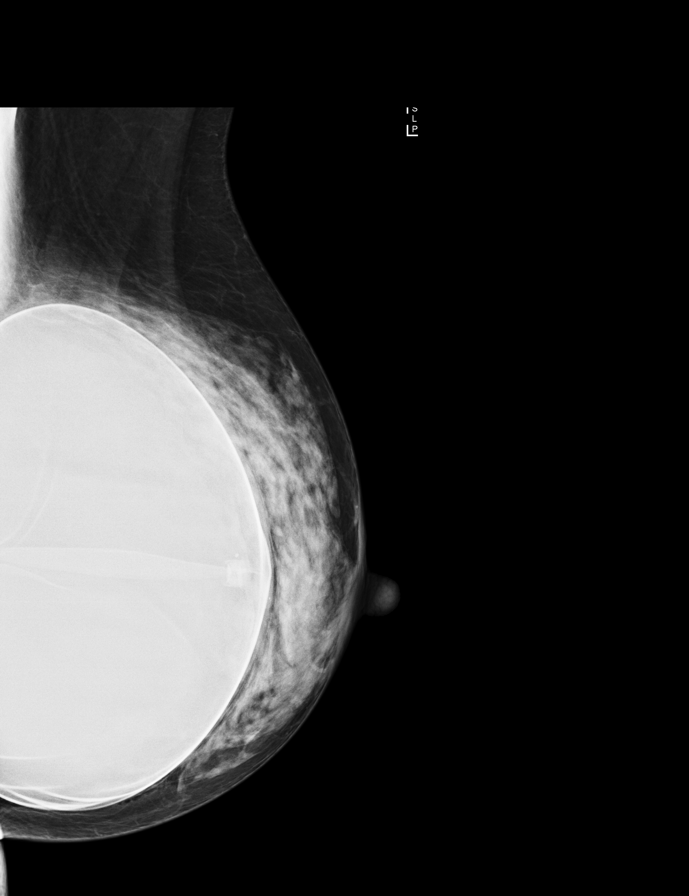

[R CC synth-2D]
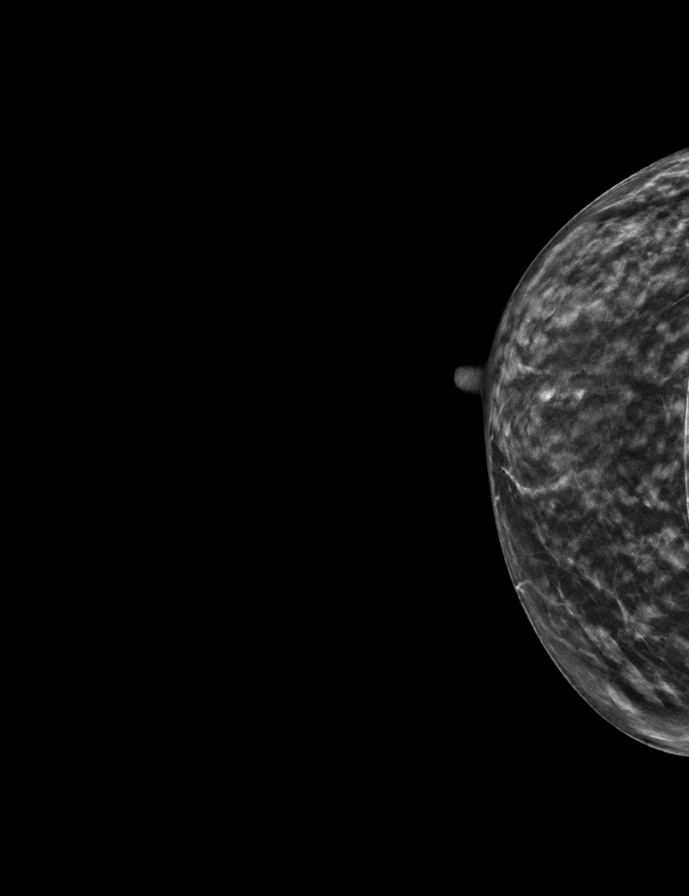

[L CC synth-2D]
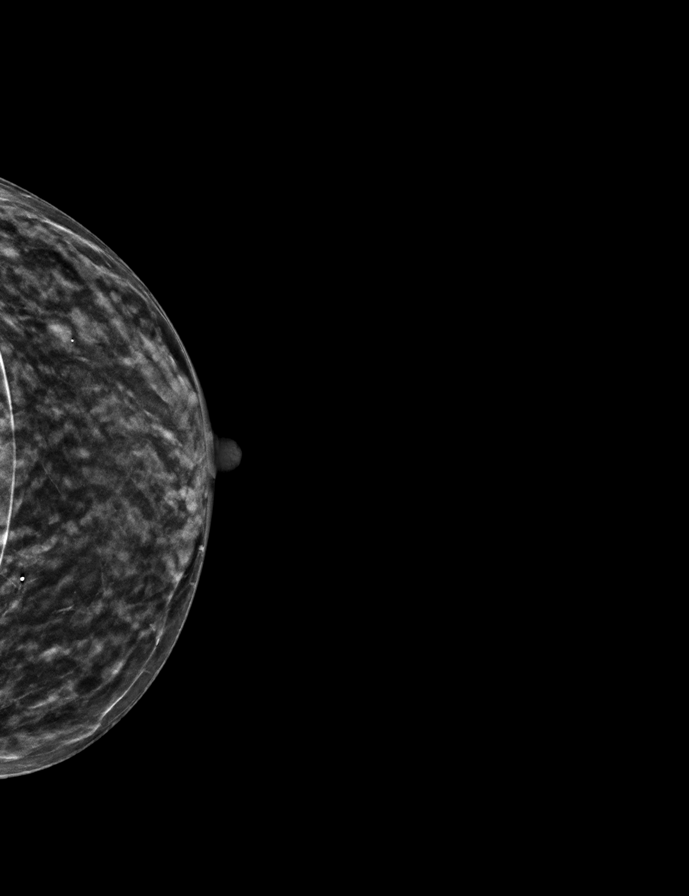

[L MLO synth-2D]
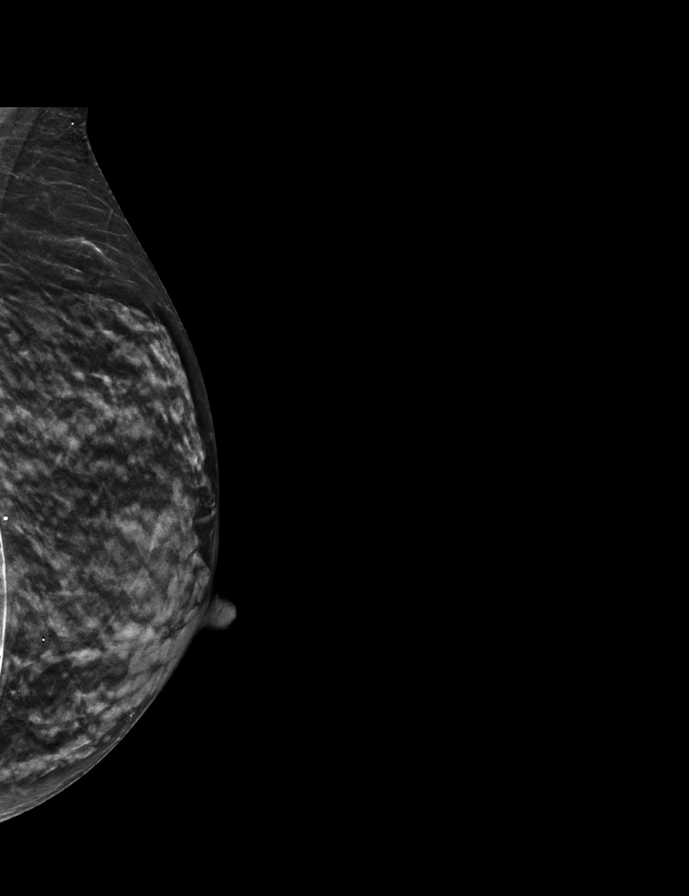

[R MLO synth-2D]
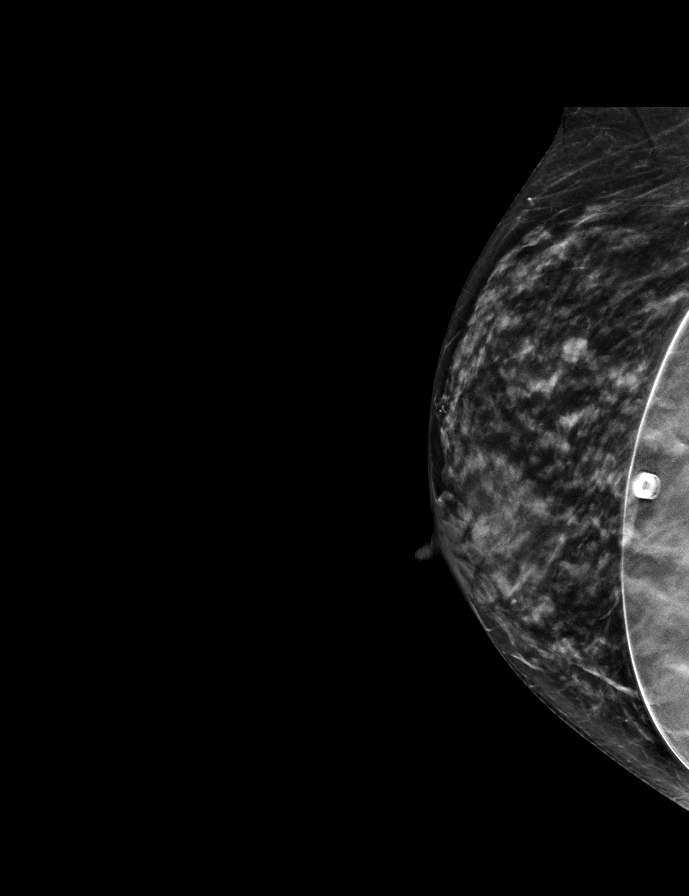

[R CCID BREAST TOMOSYNTHESIS IMAGE tomo · tomo slice 19/37.0]
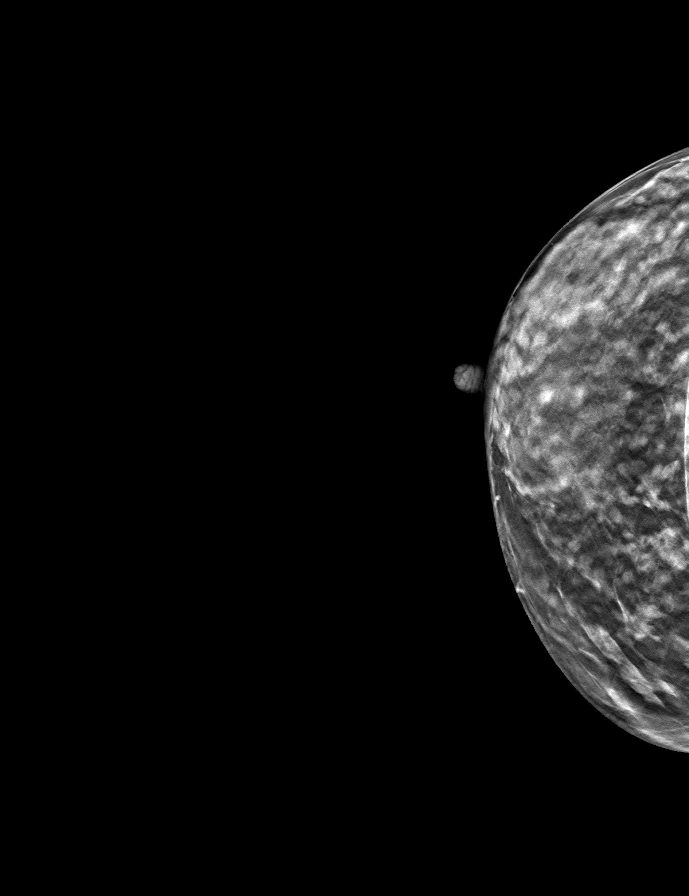

[9 of 28 positions shown; findings below may reference images not displayed]

ACR Breast Density Category c: The breast tissue is heterogeneously
dense, which may obscure small masses.
FINDINGS: In the right breast, a possible mass warrants further evaluation. In
the left breast, no findings suspicious for malignancy. Images were
processed with CAD.
IMPRESSION: Further evaluation is suggested for possible mass in the right
breast.

RECOMMENDATION:
3D diagnostic mammogram and possibly ultrasound of the right breast.
(Code:PD-D-EEX)

The patient will be contacted regarding the findings, and additional
imaging will be scheduled.

BI-RADS CATEGORY  0: Incomplete. Need additional imaging evaluation
and/or prior mammograms for comparison.
